# Patient Record
Sex: Female | Born: 1935 | Race: White | Hispanic: No | Smoking: Never smoker
Health system: Southern US, Community
[De-identification: ages and names within clinical notes are randomized; demographics above are authoritative.]

## PROBLEM LIST (undated history)

## (undated) DIAGNOSIS — E785 Hyperlipidemia, unspecified: Secondary | ICD-10-CM

## (undated) DIAGNOSIS — K219 Gastro-esophageal reflux disease without esophagitis: Secondary | ICD-10-CM

## (undated) DIAGNOSIS — C801 Malignant (primary) neoplasm, unspecified: Secondary | ICD-10-CM

## (undated) DIAGNOSIS — I10 Essential (primary) hypertension: Secondary | ICD-10-CM

---

## 2005-08-21 ENCOUNTER — Ambulatory Visit: Payer: Self-pay | Admitting: Internal Medicine

## 2005-09-11 ENCOUNTER — Ambulatory Visit: Payer: Self-pay | Admitting: Unknown Physician Specialty

## 2006-09-23 ENCOUNTER — Ambulatory Visit: Payer: Self-pay | Admitting: Unknown Physician Specialty

## 2007-10-14 ENCOUNTER — Ambulatory Visit: Payer: Self-pay | Admitting: Unknown Physician Specialty

## 2008-10-17 ENCOUNTER — Ambulatory Visit: Payer: Self-pay | Admitting: Unknown Physician Specialty

## 2008-12-19 ENCOUNTER — Ambulatory Visit: Payer: Self-pay | Admitting: Gastroenterology

## 2009-11-13 ENCOUNTER — Ambulatory Visit: Payer: Self-pay | Admitting: Unknown Physician Specialty

## 2009-11-16 ENCOUNTER — Ambulatory Visit: Payer: Self-pay | Admitting: Unknown Physician Specialty

## 2009-12-08 ENCOUNTER — Inpatient Hospital Stay: Payer: Self-pay | Admitting: Orthopedic Surgery

## 2009-12-14 ENCOUNTER — Encounter: Payer: Self-pay | Admitting: Internal Medicine

## 2010-11-29 ENCOUNTER — Ambulatory Visit: Payer: Self-pay | Admitting: Unknown Physician Specialty

## 2011-12-26 ENCOUNTER — Ambulatory Visit: Payer: Self-pay | Admitting: Unknown Physician Specialty

## 2012-01-24 ENCOUNTER — Ambulatory Visit: Payer: Self-pay | Admitting: Gastroenterology

## 2012-04-15 ENCOUNTER — Ambulatory Visit: Payer: Self-pay | Admitting: Family Medicine

## 2013-01-20 ENCOUNTER — Ambulatory Visit: Payer: Self-pay | Admitting: Family Medicine

## 2014-02-02 ENCOUNTER — Ambulatory Visit: Payer: Self-pay | Admitting: Family Medicine

## 2015-03-14 ENCOUNTER — Ambulatory Visit
Admit: 2015-03-14 | Disposition: A | Discharge status: Home / Self Care | Payer: Self-pay | Attending: Gastroenterology | Admitting: Gastroenterology

## 2015-03-20 LAB — SURGICAL PATHOLOGY

## 2015-10-14 ENCOUNTER — Emergency Department
Admission: EM | Admit: 2015-10-14 | Discharge: 2015-10-14 | Disposition: A | Discharge status: Home / Self Care | Payer: Medicare Other | Attending: Emergency Medicine | Admitting: Emergency Medicine

## 2015-10-14 ENCOUNTER — Emergency Department: Payer: Medicare Other

## 2015-10-14 ENCOUNTER — Encounter: Payer: Self-pay | Admitting: Emergency Medicine

## 2015-10-14 DIAGNOSIS — I10 Essential (primary) hypertension: Secondary | ICD-10-CM | POA: Insufficient documentation

## 2015-10-14 DIAGNOSIS — Y998 Other external cause status: Secondary | ICD-10-CM | POA: Diagnosis not present

## 2015-10-14 DIAGNOSIS — Y92009 Unspecified place in unspecified non-institutional (private) residence as the place of occurrence of the external cause: Secondary | ICD-10-CM | POA: Insufficient documentation

## 2015-10-14 DIAGNOSIS — S52615A Nondisplaced fracture of left ulna styloid process, initial encounter for closed fracture: Secondary | ICD-10-CM | POA: Diagnosis not present

## 2015-10-14 DIAGNOSIS — W172XXA Fall into hole, initial encounter: Secondary | ICD-10-CM | POA: Diagnosis not present

## 2015-10-14 DIAGNOSIS — S52612A Displaced fracture of left ulna styloid process, initial encounter for closed fracture: Secondary | ICD-10-CM

## 2015-10-14 DIAGNOSIS — S52122A Displaced fracture of head of left radius, initial encounter for closed fracture: Secondary | ICD-10-CM | POA: Insufficient documentation

## 2015-10-14 DIAGNOSIS — S299XXA Unspecified injury of thorax, initial encounter: Secondary | ICD-10-CM | POA: Diagnosis not present

## 2015-10-14 DIAGNOSIS — Y9389 Activity, other specified: Secondary | ICD-10-CM | POA: Diagnosis not present

## 2015-10-14 DIAGNOSIS — S6992XA Unspecified injury of left wrist, hand and finger(s), initial encounter: Secondary | ICD-10-CM | POA: Diagnosis present

## 2015-10-14 HISTORY — DX: Gastro-esophageal reflux disease without esophagitis: K21.9

## 2015-10-14 HISTORY — DX: Hyperlipidemia, unspecified: E78.5

## 2015-10-14 HISTORY — DX: Essential (primary) hypertension: I10

## 2015-10-14 MED ORDER — IBUPROFEN 400 MG PO TABS: 800.0000 mg | ORAL_TABLET | Freq: Three times a day (TID) | ORAL | Status: AC | PRN

## 2015-10-14 NOTE — ED Notes (Signed)
Pt to ed with c/o left wrist pain after fall into ditch yesterday at home.

## 2015-10-14 NOTE — Discharge Instructions (Signed)
Cast or Splint Care °Casts and splints support injured limbs and keep bones from moving while they heal. It is important to care for your cast or splint at home.   °HOME CARE INSTRUCTIONS °· Keep the cast or splint uncovered during the drying period. It can take 24 to 48 hours to dry if it is made of plaster. A fiberglass cast will dry in less than 1 hour. °· Do not rest the cast on anything harder than a pillow for the first 24 hours. °· Do not put weight on your injured limb or apply pressure to the cast until your health care provider gives you permission. °· Keep the cast or splint dry. Wet casts or splints can lose their shape and may not support the limb as well. A wet cast that has lost its shape can also create harmful pressure on your skin when it dries. Also, wet skin can become infected. °· Cover the cast or splint with a plastic bag when bathing or when out in the rain or snow. If the cast is on the trunk of the body, take sponge baths until the cast is removed. °· If your cast does become wet, dry it with a towel or a blow dryer on the cool setting only. °· Keep your cast or splint clean. Soiled casts may be wiped with a moistened cloth. °· Do not place any hard or soft foreign objects under your cast or splint, such as cotton, toilet paper, lotion, or powder. °· Do not try to scratch the skin under the cast with any object. The object could get stuck inside the cast. Also, scratching could lead to an infection. If itching is a problem, use a blow dryer on a cool setting to relieve discomfort. °· Do not trim or cut your cast or remove padding from inside of it. °· Exercise all joints next to the injury that are not immobilized by the cast or splint. For example, if you have a long leg cast, exercise the hip joint and toes. If you have an arm cast or splint, exercise the shoulder, elbow, thumb, and fingers. °· Elevate your injured arm or leg on 1 or 2 pillows for the first 1 to 3 days to decrease  swelling and pain. It is best if you can comfortably elevate your cast so it is higher than your heart. °SEEK MEDICAL CARE IF:  °· Your cast or splint cracks. °· Your cast or splint is too tight or too loose. °· You have unbearable itching inside the cast. °· Your cast becomes wet or develops a soft spot or area. °· You have a bad smell coming from inside your cast. °· You get an object stuck under your cast. °· Your skin around the cast becomes red or raw. °· You have new pain or worsening pain after the cast has been applied. °SEEK IMMEDIATE MEDICAL CARE IF:  °· You have fluid leaking through the cast. °· You are unable to move your fingers or toes. °· You have discolored (blue or white), cool, painful, or very swollen fingers or toes beyond the cast. °· You have tingling or numbness around the injured area. °· You have severe pain or pressure under the cast. °· You have any difficulty with your breathing or have shortness of breath. °· You have chest pain. °  °This information is not intended to replace advice given to you by your health care provider. Make sure you discuss any questions you have with your health care   provider.   Document Released: 11/08/2000 Document Revised: 09/01/2013 Document Reviewed: 05/20/2013 Elsevier Interactive Patient Education 2016 Elsevier Inc.   Wrist Fracture A wrist fracture is a break or crack in one of the bones of your wrist. Your wrist is made up of eight small bones at the palm of your hand (carpal bones) and two long bones that make up your forearm (radius and ulna). The goal of treatment is to hold the injured bone in place while it heals. Surgery may or may not be needed to care for your injured wrist.  HOME CARE  Keep your injured wrist raised (elevated). Move your fingers as much as you can.  Do not put pressure on any part of your cast or splint. It may break.  Use a plastic bag to protect your cast or splint from water while bathing or showering. Do not  lower your cast or splint into water.  Take medicines only as told by your doctor.  Keep your cast or splint clean and dry. If it gets wet, damaged, or suddenly feels too tight, tell your doctor right away.  Do not use any tobacco products including cigarettes, chewing tobacco, or electronic cigarettes. Tobacco can slow bone healing. If you need help quitting, ask your doctor.  Keep all follow-up visits as told by your doctor. This is important.  Ask your doctor if you should take supplements of calcium and vitamins C and D. GET HELP IF:   Your cast or splint is damaged, breaks, or gets wet.  You have a fever.  You have chills.  You have very bad pain that does not go away.  You have more swelling (inflammation) than before the cast was put on. GET HELP RIGHT AWAY IF:   Your hand or fingernails on the injured arm turn blue or gray, or feel cold or numb.  You lose some feeling in the fingers of your injured arm. MAKE SURE YOU:   Understand these instructions.  Will watch your condition.  Will get help right away if you are not doing well or get worse.   This information is not intended to replace advice given to you by your health care provider. Make sure you discuss any questions you have with your health care provider.   Document Released: 04/29/2008 Document Revised: 12/02/2014 Document Reviewed: 05/25/2012 Elsevier Interactive Patient Education 2016 Elsevier Inc.   Radial Head Fracture A radial head fracture is a break of the radius bone in the forearm. The radial head is the part of the bone near the elbow. These breaks often happen during a fall when you land on the outstretched arm.  HOME CARE  Raise (elevate) the injured part while sitting or lying down.  Put ice on the injured area.  Put ice in a plastic bag.  Place a towel between your skin and the bag.  Leave the ice on for 15-20 minutes, 03-04 times a day.  Move your fingers.  If you have a plaster  or fiberglass cast:  Do not try to scratch the skin under the cast.  Check the skin around the cast every day. Put lotion on any red or sore areas if needed.  Keep your cast dry and clean.  If you have a plaster splint:  Wear the splint as told.  Loosen the elastic around the splint if your fingers become numb, tingle, or turn cold or blue.  Do not put pressure on any part of your cast or splint. Rest your cast  on a pillow for the first 24 hours.  Protect your cast or splint during bathing with a plastic bag. Do not put the cast or splint in water.  Only take medicine as told by your doctor.  Follow up with your doctor. This is important.  Do not over do exercises. GET HELP RIGHT AWAY IF:   Your cast or splint gets damaged or breaks.  You have more pain or puffiness (swelling) than you did before getting the cast.  You have severe pain when stretching your fingers.  There is a bad smell, new stains or yellowish white fluid (pus) coming from under the cast.  Your fingers or hand turn pale, blue, or become cold or lose feeling (numb). MAKE SURE YOU:  Understand these instructions.  Will watch your condition.  Will get help right away if you are not doing well or get worse.   This information is not intended to replace advice given to you by your health care provider. Make sure you discuss any questions you have with your health care provider.   Document Released: 10/30/2009 Document Revised: 02/03/2012 Document Reviewed: 05/24/2015 Elsevier Interactive Patient Education [employer].

## 2015-10-14 NOTE — Care Management Note (Signed)
Case Management Note  Patient Details  Name: Misty Hernandez MRN: [id number] Date of Birth: 01/11/1932  Subjective/Objective:                    Action/Plan:   Expected Discharge Date:                  Expected Discharge Plan:     In-House Referral:     Discharge planning Services     Post Acute Care Choice:    Choice offered to:     DME Arranged:    DME Agency:     HH Arranged:    [place] Agency:     Status of Service:     Medicare Important Message Given:    Date Medicare IM Given:    Medicare IM give by:    Date Additional Medicare IM Given:    Additional Medicare Important Message give by:     If discussed at [place] of Stay Meetings, dates discussed:    Additional Comments:recieved phone call around 1400 from [facility] with [place] inquiring if patient was still here or discharged. I informed her already discharged and was waiting for [facility] to call her to arrange a delivery time. She informed me they could not make delivery to the patient's house but the family could come and pick up the walker /flat form attachment. She informed me she would call the family and I verified the number from the chart. Shortly after conversation ith Misty Hernandez I received a phone call from Misty Hernandez [phone] patient's family member. She reports no call from [facility] and that she called [facility] and was told they could come pick it up , she was asked where she was coming from she said [place] she was told they were closed and could not pick up the walker until Monday. I apologized and told her I would call [facility] and find out what our options were. I called and spoke with Chasity explained the above information she informed me she would call Misty Hernandez. I did a follow up call to Misty Hernandez she informed me no follow up call from [facility].  Will follow up tomorrow 11.20.16 [facility] closed for the evening except for emergencies. I will call Misty Hernandez tomorrow with a follow up she verbalized understanding.   Misty Bible,  RN 10/14/2015, 8:06 PM

## 2015-10-14 NOTE — ED Provider Notes (Signed)
[facility] Emergency Department Provider Note  ____________________________________________  Time seen: Approximately 9:30 AM  I have reviewed the triage vital signs and the nursing notes.   HISTORY  Chief Complaint Wrist Pain    HPI Misty Hernandez is a 81 y.o. female who presents emergency room with left wrist pain. Patient reports that she fell into the ditch yesterday at home. Complains of continued wrist pain. In addition she complains of having some left lateral rib pain from where she fell. Denies any shortness of breath or chest pains at this time.   Past Medical History  Diagnosis Date  . GERD (gastroesophageal reflux disease)   . Hyperlipidemia   . Hypertension     There are no active problems to display for this patient.   History reviewed. No pertinent past surgical history.  Current Outpatient Rx  Name  Route  Sig  Dispense  Refill  . ibuprofen (ADVIL,MOTRIN) 400 MG tablet   Oral   Take 2 tablets (800 mg total) by mouth every 8 (eight) hours as needed.   30 tablet   0     Allergies Review of patient's allergies indicates no known allergies.  History reviewed. No pertinent family history.  Social History Social History  Substance Use Topics  . Smoking status: Never Smoker   . Smokeless tobacco: None  . Alcohol Use: No    Review of Systems Constitutional: No fever/chills Eyes: No visual changes. ENT: No sore throat. Cardiovascular: Denies chest pain. Respiratory: Denies shortness of breath. Gastrointestinal: No abdominal pain.  No nausea, no vomiting.  No diarrhea.  No constipation. Genitourinary: Negative for dysuria. Musculoskeletal: Positive for left wrist pain. Skin: Negative for rash. Neurological: Negative for headaches, focal weakness or numbness.  10-point ROS otherwise negative.  ____________________________________________   PHYSICAL EXAM:  VITAL SIGNS: ED Triage Vitals  Enc Vitals Group     BP  10/14/15 0922 153/48 mmHg     Pulse Rate 10/14/15 0922 71     Resp 10/14/15 0922 18     Temp 10/14/15 0922 98.1 F (36.7 C)     Temp Source 10/14/15 0922 Oral     SpO2 10/14/15 0922 95 %     Weight 10/14/15 0922 190 lb (86.183 kg)     Height 10/14/15 0922 5\' 6"  (1.676 m)     Head Cir --      Peak Flow --      Pain Score 10/14/15 0918 8     Pain Loc --      Pain Edu? --      Excl. in [place]? --     Constitutional: Alert and oriented. Well appearing and in no acute distress.  Cardiovascular: Normal rate, regular rhythm. Grossly normal heart sounds.  Good peripheral circulation. Respiratory: Normal respiratory effort.  No retractions. Lungs CTAB. Musculoskeletal: Left wrist with obvious edema. Distally neurovascularly intact increase pain with pronation supination movement. Left rib cage laterally midaxilla point tenderness no evidence of bruising or ecchymosis. Neurologic:  Normal speech and language. No gross focal neurologic deficits are appreciated. No gait instability. Skin:  Skin is warm, dry and intact. No rash noted. Psychiatric: Mood and affect are normal. Speech and behavior are normal.  ____________________________________________   LABS (all labs ordered are listed, but only abnormal results are displayed)  Labs Reviewed - No data to display  RADIOLOGY  Left wrist: FINDINGS: The bones are osteopenic. There is a mildly impacted fracture involving the distal radius. Mild dorsal angulation of the  distal fracture fragments noted. Nondisplaced ulnar styloid fracture also noted.  IMPRESSION: 1. Distal radius fracture. 2. Ulnar styloid fracture.  Left ribs: Negative for fracture. ____________________________________________   PROCEDURES  Procedure(s) performed: None  Critical Care performed: No  ____________________________________________   INITIAL IMPRESSION / ASSESSMENT AND PLAN / ED COURSE  Pertinent labs & imaging results that were available during my  care of the patient were reviewed by me and considered in my medical decision making (see chart for details).  Discussed all clinical findings with Dr. Mack Guise, orthopedic M.D. on call. We'll place patient and sugar tong splint with a sling. Patient follow-up with him next week for further intervention. Rx given ____________________________________________   FINAL CLINICAL IMPRESSION(S) / ED DIAGNOSES  Final diagnoses:  Radial head fracture, closed, left, initial encounter  Fracture of ulnar styloid, left, closed, initial encounter      Arlyss Repress, PA-C 10/14/15 1233  Orbie Pyo, MD 10/14/15 [phone]

## 2015-10-14 NOTE — Care Management Note (Signed)
Case Management Note  Patient Details  Name: Misty Hernandez MRN: [id number] Date of Birth: [birth year]  Subjective/Objective:   Request for walker with flat form attachment.               Action/Plan:Information faxed to [facility] will call pateint to confirm delivery time.   Expected Discharge Date:                  Expected Discharge Plan:     In-House Referral:     Discharge planning Services     Post Acute Care Choice:    Choice offered to:     DME Arranged:  Walker with Flat form attachment  DME Agency:     HH Arranged:   [place] / [place] Agency:     Status of Service:     Medicare Important Message Given:    Date Medicare IM Given:    Medicare IM give by:    Date Additional Medicare IM Given:    Additional Medicare Important Message give by:     If discussed at [place] of Stay Meetings, dates discussed:    Additional Comments:  Ival Bible, RN 10/14/2015, 1:15 PM

## 2015-10-14 NOTE — Consult Note (Signed)
ORTHOPAEDIC CONSULTATION  REQUESTING PHYSICIAN: Wynema Birch, PA  Chief Complaint: Left wrist pain status post fall  HPI: Misty Hernandez is a 81 y.o. female who complains of  left wrist pain after a fall at home today. She is seen in the ER with her family at the bedside. Patient states that she went to a mailbox and fell when she stepped in a hole. The family also explains that she has had trouble with her balance. She saw a neurologist last week for her balance issues. She uses a cane or a walker for assistance with ambulation. Patient has pain in the left wrist but denies numbness or tingling.  Past Medical History  Diagnosis Date  . GERD (gastroesophageal reflux disease)   . Hyperlipidemia   . Hypertension    History reviewed. No pertinent past surgical history. Social History   Social History  . Marital Status: Widowed    Spouse Name: N/A  . Number of Children: N/A  . Years of Education: N/A   Social History Main Topics  . Smoking status: Never Smoker   . Smokeless tobacco: None  . Alcohol Use: No  . Drug Use: No  . Sexual Activity: Not Asked   Other Topics Concern  . None   Social History Narrative  . None   History reviewed. No pertinent family history. No Known Allergies Prior to Admission medications   Medication Sig Start Date End Date Taking? Authorizing Provider  ibuprofen (ADVIL,MOTRIN) 400 MG tablet Take 2 tablets (800 mg total) by mouth every 8 (eight) hours as needed. 10/14/15   Arlyss Repress, PA-C   Dg Ribs Unilateral W/chest Left  10/14/2015  CLINICAL DATA:  Fall.  Pain to left mid ribs under left breast. EXAM: LEFT RIBS AND CHEST - 3+ VIEW COMPARISON:  12/08/2009 FINDINGS: Heart size is normal. No pleural effusion or edema. There is no airspace consolidation identified. Aortic atherosclerosis noted. No displaced fractures identified. IMPRESSION: 1. No acute findings. 2. Aortic atherosclerosis. Electronically Signed   By: Kerby Moors M.D.   On:  10/14/2015 10:17   Dg Wrist Complete Left  10/14/2015  CLINICAL DATA:  Left wrist pain and swelling after fall. EXAM: LEFT WRIST - COMPLETE 3+ VIEW COMPARISON:  None. FINDINGS: The bones are osteopenic. There is a mildly impacted fracture involving the distal radius. Mild dorsal angulation of the distal fracture fragments noted. Nondisplaced ulnar styloid fracture also noted. IMPRESSION: 1. Distal radius fracture. 2. Ulnar styloid fracture. Electronically Signed   By: Kerby Moors M.D.   On: 10/14/2015 09:47    Positive ROS: All other systems have been reviewed and were otherwise negative with the exception of those mentioned in the HPI and as above.  Physical Exam: General: Alert, no acute distress  MUSCULOSKELETAL: Left wrist: Patient has intact skin. She has swelling over the left wrist. She tenderness of the distal radius but no significant induration or deformity. She can flex and extend all fingers of the left hand. Her fingers are well-perfused and she has a palpable radial pulse. There is no pain with passive stretch of her left fingers. Her forearm compartments are soft and compressible as are the hand compartments.  Assessment: Impacted left distal radius fracture with mild dorsal angulation  Plan: I reviewed the left wrist films taken in the ER today. The patient is an impacted fracture of the left distal radius with mild dorsal angulation and no significant loss of radial height. I explained to the patient that at this time  I would recommend a sugar tong splint be applied by the ED staff. She will elevate her left upper extremity at home. She'll follow up with me in a proximal ascending to 10 days in my office. New x-rays will be taken at that visit. If the patient maintains her current fracture position that she'll be transitioned to a short arm cast. If she shows further displacement of the fracture than a discussion regarding open reduction internal fixation will be had. One of the  patient's family members is a Marine scientist from our PACU. The patient and her family understood and agreed with this plan. I relayed this plan also to [employer], PA in the ER.    Th[place], MD    10/14/2015 5:03 PM

## 2015-10-15 NOTE — Care Management Note (Signed)
Case Management Note  Patient Details  Name: Misty Hernandez MRN: [id number] Date of Birth: [birth year]  Subjective/Objective:    Placed call to [place] spoke with Chasity to follow up on DME order . She states she left a detailed voicemail yesterday, for [name] [phone] they needed the walker immediately she could arrange delivery to the hospital for family to pick up"  She did not get a return call from [place]. She will call [name] this AM and update with disposition.           Action/Plan:   Expected Discharge Date:                  Expected Discharge Plan:     In-House Referral:     Discharge planning Services     Post Acute Care Choice:    Choice offered to:     DME Arranged:    DME Agency:     HH Arranged:    [place] Agency:     Status of Service:     Medicare Important Message Given:    Date Medicare IM Given:    Medicare IM give by:    Date Additional Medicare IM Given:    Additional Medicare Important Message give by:     If discussed at [place] of Stay Meetings, dates discussed:    Additional Comments:  Ival Bible, RN 10/15/2015, 8:22 AM

## 2015-10-15 NOTE — Care Management Note (Signed)
Case Management Note  Patient Details  Name: Misty Hernandez MRN: [id number] Date of Birth: 09-10-1932  Subjective/Objective:                    Action/Plan:  Spoke with Chasity from [employer], she spoke with [name] patient's daughter , she confirmed she did receive both voicemails from [place].  Chasity states." [name] or her sister [name] will pick up the walker tomorrow in [place], they can manage until then and will see [employer] at the store.  Chasity stated ," She was sorry for the miscommunication and [name] was receptive".     Expected Discharge Date:                  Expected Discharge Plan:     In-House Referral:     Discharge planning Services     Post Acute Care Choice:    Choice offered to:     DME Arranged:    DME Agency:     HH Arranged:    [place] Agency:     Status of Service:     Medicare Important Message Given:    Date Medicare IM Given:    Medicare IM give by:    Date Additional Medicare IM Given:    Additional Medicare Important Message give by:     If discussed at [place] of Stay Meetings, dates discussed:    Additional Comments:  Ival Bible, RN 10/15/2015, 4:12 PM

## 2015-11-14 ENCOUNTER — Encounter: Payer: Self-pay | Admitting: Emergency Medicine

## 2015-11-14 ENCOUNTER — Observation Stay
Admission: EM | Admit: 2015-11-14 | Discharge: 2015-11-21 | Disposition: A | Discharge status: Skilled Nursing Facility | Payer: Medicare Other | Attending: Internal Medicine | Admitting: Internal Medicine

## 2015-11-14 ENCOUNTER — Emergency Department: Payer: Medicare Other

## 2015-11-14 DIAGNOSIS — I776 Arteritis, unspecified: Secondary | ICD-10-CM

## 2015-11-14 DIAGNOSIS — R600 Localized edema: Secondary | ICD-10-CM | POA: Insufficient documentation

## 2015-11-14 DIAGNOSIS — R410 Disorientation, unspecified: Secondary | ICD-10-CM

## 2015-11-14 DIAGNOSIS — R300 Dysuria: Secondary | ICD-10-CM | POA: Diagnosis not present

## 2015-11-14 DIAGNOSIS — E785 Hyperlipidemia, unspecified: Secondary | ICD-10-CM | POA: Diagnosis not present

## 2015-11-14 DIAGNOSIS — I482 Chronic atrial fibrillation: Secondary | ICD-10-CM | POA: Diagnosis not present

## 2015-11-14 DIAGNOSIS — I10 Essential (primary) hypertension: Secondary | ICD-10-CM | POA: Insufficient documentation

## 2015-11-14 DIAGNOSIS — Z800 Family history of malignant neoplasm of digestive organs: Secondary | ICD-10-CM | POA: Diagnosis not present

## 2015-11-14 DIAGNOSIS — Z7901 Long term (current) use of anticoagulants: Secondary | ICD-10-CM | POA: Insufficient documentation

## 2015-11-14 DIAGNOSIS — R296 Repeated falls: Secondary | ICD-10-CM | POA: Diagnosis not present

## 2015-11-14 DIAGNOSIS — K219 Gastro-esophageal reflux disease without esophagitis: Secondary | ICD-10-CM | POA: Insufficient documentation

## 2015-11-14 DIAGNOSIS — I6523 Occlusion and stenosis of bilateral carotid arteries: Secondary | ICD-10-CM | POA: Insufficient documentation

## 2015-11-14 DIAGNOSIS — R4182 Altered mental status, unspecified: Secondary | ICD-10-CM | POA: Diagnosis not present

## 2015-11-14 DIAGNOSIS — K5900 Constipation, unspecified: Secondary | ICD-10-CM | POA: Diagnosis not present

## 2015-11-14 DIAGNOSIS — G9340 Encephalopathy, unspecified: Secondary | ICD-10-CM | POA: Diagnosis not present

## 2015-11-14 DIAGNOSIS — R270 Ataxia, unspecified: Secondary | ICD-10-CM | POA: Diagnosis not present

## 2015-11-14 DIAGNOSIS — F0390 Unspecified dementia without behavioral disturbance: Secondary | ICD-10-CM | POA: Insufficient documentation

## 2015-11-14 DIAGNOSIS — R42 Dizziness and giddiness: Secondary | ICD-10-CM | POA: Insufficient documentation

## 2015-11-14 DIAGNOSIS — I480 Paroxysmal atrial fibrillation: Secondary | ICD-10-CM | POA: Diagnosis not present

## 2015-11-14 DIAGNOSIS — Z79899 Other long term (current) drug therapy: Secondary | ICD-10-CM | POA: Diagnosis not present

## 2015-11-14 HISTORY — DX: Malignant (primary) neoplasm, unspecified: C80.1

## 2015-11-14 LAB — CBC WITH DIFFERENTIAL/PLATELET
BASOS ABS: 0 10*3/uL (ref 0–0.1)
Basophils Relative: 0 %
Eosinophils Absolute: 0.2 10*3/uL (ref 0–0.7)
Eosinophils Relative: 2 %
HCT: 42.8 % (ref 35.0–47.0)
Hemoglobin: 14.4 g/dL (ref 12.0–16.0)
LYMPHS ABS: 1.7 10*3/uL (ref 1.0–3.6)
Lymphocytes Relative: 20 %
MCH: 30.4 pg (ref 26.0–34.0)
MCHC: 33.5 g/dL (ref 32.0–36.0)
MCV: 90.6 fL (ref 80.0–100.0)
Monocytes Absolute: 0.5 10*3/uL (ref 0.2–0.9)
Monocytes Relative: 7 %
NEUTROS ABS: 5.9 10*3/uL (ref 1.4–6.5)
Neutrophils Relative %: 71 %
Platelets: 294 10*3/uL (ref 150–440)
RBC: 4.72 MIL/uL (ref 3.80–5.20)
RDW: 13.8 % (ref 11.5–14.5)
WBC: 8.3 10*3/uL (ref 3.6–11.0)

## 2015-11-14 LAB — URINALYSIS COMPLETE WITH MICROSCOPIC (ARMC ONLY)
Bilirubin Urine: NEGATIVE
Glucose, UA: NEGATIVE mg/dL
Ketones, ur: NEGATIVE mg/dL
Nitrite: NEGATIVE
PROTEIN: NEGATIVE mg/dL
SPECIFIC GRAVITY, URINE: 1.018 (ref 1.005–1.030)
pH: 5 (ref 5.0–8.0)

## 2015-11-14 LAB — COMPREHENSIVE METABOLIC PANEL
ALBUMIN: 4.4 g/dL (ref 3.5–5.0)
ALK PHOS: 89 U/L (ref 38–126)
ALT: 9 U/L — AB (ref 14–54)
AST: 19 U/L (ref 15–41)
Anion gap: 10 (ref 5–15)
BILIRUBIN TOTAL: 0.6 mg/dL (ref 0.3–1.2)
BUN: 23 mg/dL — ABNORMAL HIGH (ref 6–20)
CHLORIDE: 98 mmol/L — AB (ref 101–111)
CO2: 29 mmol/L (ref 22–32)
Calcium: 9.2 mg/dL (ref 8.9–10.3)
Creatinine, Ser: 1.04 mg/dL — ABNORMAL HIGH (ref 0.44–1.00)
GFR calc Af Amer: 57 mL/min — ABNORMAL LOW (ref >60)
GFR calc non Af Amer: 49 mL/min — ABNORMAL LOW (ref >60)
GLUCOSE: 114 mg/dL — AB (ref 65–99)
Potassium: 3.7 mmol/L (ref 3.5–5.1)
SODIUM: 137 mmol/L (ref 135–145)
TOTAL PROTEIN: 6.9 g/dL (ref 6.5–8.1)

## 2015-11-14 LAB — TROPONIN I

## 2015-11-14 LAB — TSH: TSH: 1.747 u[IU]/mL (ref 0.350–4.500)

## 2015-11-14 NOTE — ED Notes (Signed)
Patient presents to ED form home with c/o dysuria with (+) vertiginous symptoms that have been worsening over the last few months.Patient has experienced an extended history over the last few months; has been to PCP, [facility], [facility], and [facility]. Daughter reports that patient was "fine" in September and has been going down since then. Patient has been falling around more and has become incontinent of urine. Patient seen and evaluated by [facility] Nakayama and started on Gabapentin. While at [facility] recently she had a NEGATIVE CT, MRI, and full neurological workup per daughter's report. Family having to stay with patient ATC now; also receiving PT/OT services in the home.

## 2015-11-15 ENCOUNTER — Observation Stay: Payer: Medicare Other

## 2015-11-15 ENCOUNTER — Encounter: Payer: Self-pay | Admitting: Internal Medicine

## 2015-11-15 DIAGNOSIS — R296 Repeated falls: Secondary | ICD-10-CM

## 2015-11-15 LAB — CREATININE, SERUM
Creatinine, Ser: 0.92 mg/dL (ref 0.44–1.00)
GFR, EST NON AFRICAN AMERICAN: 57 mL/min — AB (ref >60)

## 2015-11-15 LAB — CBC
HCT: 39.9 % (ref 35.0–47.0)
Hemoglobin: 13.5 g/dL (ref 12.0–16.0)
MCH: 30.4 pg (ref 26.0–34.0)
MCHC: 33.9 g/dL (ref 32.0–36.0)
MCV: 89.6 fL (ref 80.0–100.0)
Platelets: 223 10*3/uL (ref 150–440)
RBC: 4.45 MIL/uL (ref 3.80–5.20)
RDW: 13.7 % (ref 11.5–14.5)
WBC: 7.2 10*3/uL (ref 3.6–11.0)

## 2015-11-15 LAB — SEDIMENTATION RATE: Sed Rate: 10 mm/hr (ref 0–30)

## 2015-11-15 LAB — AMMONIA: AMMONIA: 16 umol/L (ref 9–35)

## 2015-11-15 LAB — VITAMIN B12: VITAMIN B 12: 524 pg/mL (ref 180–914)

## 2015-11-15 LAB — FOLATE: Folate: 30 ng/mL (ref >5.9)

## 2015-11-15 MED ORDER — APIXABAN 5 MG PO TABS
5.0000 mg | ORAL_TABLET | Freq: Two times a day (BID) | ORAL | Status: DC
  Administered 2015-11-15 – 2015-11-16 (×3): 5 mg via ORAL
  Filled 2015-11-15 (×3): qty 1

## 2015-11-15 MED ORDER — VITAMIN B-12 1000 MCG PO TABS
1000.0000 ug | ORAL_TABLET | Freq: Every day | ORAL | Status: DC
  Administered 2015-11-15 – 2015-11-20 (×6): 1000 ug via ORAL
  Filled 2015-11-15 (×6): qty 1

## 2015-11-15 MED ORDER — ADULT MULTIVITAMIN W/MINERALS CH
1.0000 | ORAL_TABLET | Freq: Every day | ORAL | Status: DC
  Administered 2015-11-15 – 2015-11-20 (×6): 1 via ORAL
  Filled 2015-11-15 (×6): qty 1

## 2015-11-15 MED ORDER — SODIUM CHLORIDE 0.9 % IV SOLN
INTRAVENOUS | Status: DC
  Administered 2015-11-15 (×2): via INTRAVENOUS

## 2015-11-15 MED ORDER — METOPROLOL TARTRATE 25 MG PO TABS
25.0000 mg | ORAL_TABLET | Freq: Two times a day (BID) | ORAL | Status: DC
  Administered 2015-11-15 – 2015-11-21 (×14): 25 mg via ORAL
  Filled 2015-11-15 (×14): qty 1

## 2015-11-15 MED ORDER — MECLIZINE HCL 12.5 MG PO TABS
12.5000 mg | ORAL_TABLET | Freq: Three times a day (TID) | ORAL | Status: DC | PRN
  Administered 2015-11-16 – 2015-11-17 (×2): 12.5 mg via ORAL
  Filled 2015-11-15 (×3): qty 1

## 2015-11-15 MED ORDER — HEPARIN SODIUM (PORCINE) 5000 UNIT/ML IJ SOLN: 5000.0000 [IU] | Freq: Three times a day (TID) | INTRAMUSCULAR | Status: DC

## 2015-11-15 MED ORDER — OMEGA-3-ACID ETHYL ESTERS 1 G PO CAPS
1.0000 g | ORAL_CAPSULE | Freq: Every day | ORAL | Status: DC
  Administered 2015-11-15 – 2015-11-20 (×6): 1 g via ORAL
  Filled 2015-11-15 (×6): qty 1

## 2015-11-15 MED ORDER — ACETAMINOPHEN 650 MG RE SUPP: 650.0000 mg | Freq: Four times a day (QID) | RECTAL | Status: DC | PRN

## 2015-11-15 MED ORDER — ONDANSETRON HCL 4 MG PO TABS: 4.0000 mg | ORAL_TABLET | Freq: Four times a day (QID) | ORAL | Status: DC | PRN

## 2015-11-15 MED ORDER — HYDRALAZINE HCL 20 MG/ML IJ SOLN
10.0000 mg | INTRAMUSCULAR | Status: AC
  Administered 2015-11-15: 10 mg via INTRAVENOUS
  Filled 2015-11-15: qty 1

## 2015-11-15 MED ORDER — ONDANSETRON HCL 4 MG/2ML IJ SOLN
4.0000 mg | Freq: Four times a day (QID) | INTRAMUSCULAR | Status: DC | PRN
Start: 2015-11-15 — End: 2015-11-21
  Administered 2015-11-20: 4 mg via INTRAVENOUS
  Filled 2015-11-15: qty 2

## 2015-11-15 MED ORDER — PANTOPRAZOLE SODIUM 40 MG PO TBEC
40.0000 mg | DELAYED_RELEASE_TABLET | Freq: Every day | ORAL | Status: DC
  Administered 2015-11-15 – 2015-11-21 (×7): 40 mg via ORAL
  Filled 2015-11-15 (×7): qty 1

## 2015-11-15 MED ORDER — HYDRALAZINE HCL 20 MG/ML IJ SOLN: 10.0000 mg | Freq: Four times a day (QID) | INTRAMUSCULAR | Status: DC | PRN

## 2015-11-15 MED ORDER — OXYCODONE HCL 5 MG PO TABS
5.0000 mg | ORAL_TABLET | ORAL | Status: DC | PRN
  Administered 2015-11-16 – 2015-11-20 (×7): 5 mg via ORAL
  Filled 2015-11-15 (×7): qty 1

## 2015-11-15 MED ORDER — ACETAMINOPHEN 325 MG PO TABS
650.0000 mg | ORAL_TABLET | Freq: Four times a day (QID) | ORAL | Status: DC | PRN
  Administered 2015-11-15 – 2015-11-20 (×3): 650 mg via ORAL
  Filled 2015-11-15 (×3): qty 2

## 2015-11-15 NOTE — Progress Notes (Signed)
Pt oob to chair after orthostats. Lends backward when sitting.

## 2015-11-15 NOTE — Progress Notes (Signed)
bp 160/44.after hydralazine. Pt now very alert and orientedx4

## 2015-11-15 NOTE — Consult Note (Signed)
[facility] Cardiology  CARDIOLOGY CONSULT NOTE  Patient ID: MAKAYLIA KOONE MRN: [id number] DOB/AGE: 30-Jan-1932 81 y.o.  Admit date: 11/14/2015 Referring Physician Verdell Carmine Primary Physician [facility] Primary Cardiologist Nehemiah Massed Reason for Consultation atrial fibrillation  HPI: 81 year old female with history of paroxysmal atrial fibrillation referred for revascularization of appropriate anticoagulation. The patient has a chads Vasc 4, currently on Eliquis for stroke prevention. The patient has had a history of falling, 3-4 episodes of falling during the past 3-4 weeks. Last episode was witnessed by her daughter. A shunt apparently was walking out of the bathroom with her walker, became lightheaded, and slumped to the floor. Patient has been evaluated by Dr. Melrose Nakayama. The patient  is admitted with recent confusion, and occult the ambulating. Head CT revealed chronic ischemic changes without any acute abnormality. Brain MRI is pending. The patient has been seen by Dr. Tamala Julian who feels that the patient may be experiencing onset dementia as well as possible seizures.  Review of systems complete and found to be negative unless listed above     Past Medical History  Diagnosis Date  . GERD (gastroesophageal reflux disease)   . Hyperlipidemia   . Hypertension     History reviewed. No pertinent past surgical history.  Prescriptions prior to admission  Medication Sig Dispense Refill Last Dose  . apixaban (ELIQUIS) 5 MG TABS tablet Take 1 tablet by mouth 2 (two) times daily.   11/14/2015 at Unknown time  . ibuprofen (ADVIL,MOTRIN) 400 MG tablet Take 2 tablets (800 mg total) by mouth every 8 (eight) hours as needed. 30 tablet 0 prn at prn  . metoprolol tartrate (LOPRESSOR) 25 MG tablet Take 25 mg by mouth 2 (two) times daily.   11/14/2015 at 0930  . Multiple Vitamin (MULTIVITAMIN WITH MINERALS) TABS tablet Take 1 tablet by mouth daily.   11/14/2015 at Unknown time  . omega-3 acid ethyl esters (LOVAZA) 1 G  capsule Take 1 g by mouth daily.   11/14/2015 at Unknown time  . omeprazole (PRILOSEC) 20 MG capsule Take 1 capsule by mouth daily.   11/14/2015 at Unknown time  . triamterene-hydrochlorothiazide (MAXZIDE-25) 37.5-25 MG tablet Take 0.5 tablets by mouth daily.   11/14/2015 at Unknown time  . vitamin B-12 (CYANOCOBALAMIN) 1000 MCG tablet Take 1,000 mcg by mouth daily.   11/14/2015 at Unknown time   Social History   Social History  . Marital Status: Widowed    Spouse Name: N/A  . Number of Children: N/A  . Years of Education: N/A   Occupational History  . Not on file.   Social History Main Topics  . Smoking status: Never Smoker   . Smokeless tobacco: Not on file  . Alcohol Use: No  . Drug Use: No  . Sexual Activity: Not on file   Other Topics Concern  . Not on file   Social History Narrative    Family History  Problem Relation Age of Onset  . Hypertension Other       Review of systems complete and found to be negative unless listed above      PHYSICAL EXAM  General: Well developed, well nourished, in no acute distress HEENT:  Normocephalic and atramatic Neck:  No JVD.  Lungs: Clear bilaterally to auscultation and percussion. Heart: HRRR . Normal S1 and S2 without gallops or murmurs.  Abdomen: Bowel sounds are positive, abdomen soft and non-tender  Msk:  Back normal, normal gait. Normal strength and tone for age. Extremities: No clubbing, cyanosis or edema.   Neuro:  Alert and oriented X 3. Psych:  Good affect, responds appropriately  Labs:   Lab Results  Component Value Date   WBC 7.2 11/15/2015   HGB 13.5 11/15/2015   HCT 39.9 11/15/2015   MCV 89.6 11/15/2015   PLT 223 11/15/2015    Recent Labs Lab 11/14/15 2103 11/15/15 0243  NA 137  --   K 3.7  --   CL 98*  --   CO2 29  --   BUN 23*  --   CREATININE 1.04* 0.92  CALCIUM 9.2  --   PROT 6.9  --   BILITOT 0.6  --   ALKPHOS 89  --   ALT 9*  --   AST 19  --   GLUCOSE 114*  --    Lab Results   Component Value Date   TROPONINI <0.03 11/14/2015   No results found for: CHOL No results found for: HDL No results found for: LDLCALC No results found for: TRIG No results found for: CHOLHDL No results found for: LDLDIRECT    Radiology: Ct Head Wo Contrast  11/14/2015  CLINICAL DATA:  Effusion EXAM: CT HEAD WITHOUT CONTRAST TECHNIQUE: Contiguous axial images were obtained from the base of the skull through the vertex without intravenous contrast. COMPARISON:  None. FINDINGS: Bony calvarium is intact. No findings to suggest acute hemorrhage, acute infarction or space-occupying mass lesion are noted. A small area decreased attenuation is noted in the anterior horn of the left internal capsule consistent with prior lacunar infarct. IMPRESSION: Chronic ischemic change without evidence of acute abnormality. Electronically Signed   By: Inez Catalina M.D.   On: 11/14/2015 21:29    EKG: Sinus rhythm  ASSESSMENT AND PLAN:   1. Paroxysmal atrial fibrillation, chads Vasc 4, currently on Eliquis, which has been tolerated well. I discussed in detail, with the patient and family members, with regard to the risks, benefits and alternatives of chronic anticoagulation. 2. Recent frequent falls, unknown etiology, possible rapid onset dementia, versus seizures, brain MRI pending, followed by neurology.  Recommendations  1. Continue current therapy 2. Continue Eliquis for now, may need to consider stopping pending neurological workup 3. Review 2-D echocardiogram  Signed: Fawn Desrocher MD,PhD, [facility] 11/15/2015, 5:14 PM

## 2015-11-15 NOTE — Progress Notes (Addendum)
bp elevated 190/80  Manually.pt very confused. Dr Verdell Carmine notified. md orders hydralazine 10mg  iv now and [id number] prn systolic [id number]

## 2015-11-15 NOTE — Progress Notes (Signed)
[place] at [place] NAME: Misty Hernandez    MR#:  [id number]  DATE OF BIRTH:  1932/10/02  SUBJECTIVE:   Patient here due to recurrent falls and dizziness. Denies any chest pain, palpitations.  Family at bedside.  No true syncope.    REVIEW OF SYSTEMS:    Review of Systems  Constitutional: Negative for fever and chills.  HENT: Negative for congestion and tinnitus.   Eyes: Negative for blurred vision and double vision.  Respiratory: Negative for cough, shortness of breath and wheezing.   Cardiovascular: Negative for chest pain, orthopnea and PND.  Gastrointestinal: Negative for nausea, vomiting, abdominal pain and diarrhea.  Genitourinary: Negative for dysuria and hematuria.  Musculoskeletal: Positive for falls.  Neurological: Positive for dizziness. Negative for sensory change and focal weakness.  All other systems reviewed and are negative.   Nutrition: Heart healthy Tolerating Diet: Yes Tolerating PT: Await Evaluation   DRUG ALLERGIES:  No Known Allergies  VITALS:  Blood pressure 151/56, pulse 59, temperature 98.3 F (36.8 C), temperature source Oral, resp. rate 15, height 5' 5"  (1.651 m), weight 85.821 kg (189 lb 3.2 oz), SpO2 96 %.  PHYSICAL EXAMINATION:   Physical Exam  GENERAL:  81 y.o.-year-old patient lying in the bed with no acute distress.  EYES: Pupils equal, round, reactive to light and accommodation. No scleral icterus. Extraocular muscles intact.  HEENT: Head atraumatic, normocephalic. Oropharynx and nasopharynx clear.  NECK:  Supple, no jugular venous distention. No thyroid enlargement, no tenderness.  LUNGS: Normal breath sounds bilaterally, no wheezing, rales, rhonchi. No use of accessory muscles of respiration.  CARDIOVASCULAR: S1, S2 normal. No murmurs, rubs, or gallops.  ABDOMEN: Soft, nontender, nondistended. Bowel sounds present. No organomegaly or mass.  EXTREMITIES: No cyanosis, clubbing or edema  b/l.  Left arm in a cast.    NEUROLOGIC: Cranial nerves II through XII are intact. No focal Motor or sensory deficits b/l.   PSYCHIATRIC: The patient is alert and oriented x 3. Good affect.  SKIN: No obvious rash, lesion, or ulcer.    LABORATORY PANEL:   CBC  Recent Labs Lab 11/15/15 0243  WBC 7.2  HGB 13.5  HCT 39.9  PLT 223   ------------------------------------------------------------------------------------------------------------------  Chemistries   Recent Labs Lab 11/14/15 2103 11/15/15 0243  NA 137  --   K 3.7  --   CL 98*  --   CO2 29  --   GLUCOSE 114*  --   BUN 23*  --   CREATININE 1.04* 0.92  CALCIUM 9.2  --   AST 19  --   ALT 9*  --   ALKPHOS 89  --   BILITOT 0.6  --    ------------------------------------------------------------------------------------------------------------------  Cardiac Enzymes  Recent Labs Lab 11/14/15 2103  TROPONINI <0.03   ------------------------------------------------------------------------------------------------------------------  RADIOLOGY:  Ct Head Wo Contrast  11/14/2015  CLINICAL DATA:  Effusion EXAM: CT HEAD WITHOUT CONTRAST TECHNIQUE: Contiguous axial images were obtained from the base of the skull through the vertex without intravenous contrast. COMPARISON:  None. FINDINGS: Bony calvarium is intact. No findings to suggest acute hemorrhage, acute infarction or space-occupying mass lesion are noted. A small area decreased attenuation is noted in the anterior horn of the left internal capsule consistent with prior lacunar infarct. IMPRESSION: Chronic ischemic change without evidence of acute abnormality. Electronically Signed   By: Inez Catalina M.D.   On: 11/14/2015 21:29     ASSESSMENT AND PLAN:   81 year old female with past  history of chronic atrial fibrillation, HTN, Hyperlipidemia, who presents to the hospital due to recurrent falls and dizziness.  #1 recurrent falls/dizziness-etiology unclear  presently. Patient apparently 2 months ago was driving and living independently. Patient CT head on admission is negative. She had an MRI of the brain at [facility] about a month or so ago which was also negative. She has a nonfocal neurological exam. -Patient is following up with Dr. Melrose Nakayama as an outpatient and has had a negative workup so far.  -Appreciate neurology input and they think this could be rapid onset dementia/questionable seizures -We'll repeat MRI of the brain, check EEG. Orthostatics are negative so far. -Check check ESR, CRP, B12/folate, vitamin E and ammonia. -Physical therapy evaluation noted.  #2 chronic atrial fibrillation-rate controlled. Continue metoprolol. -Continue Eliquis.   #3 GERD-continue Protonix.  #4 hypertension-continue metoprolol.   All the records are reviewed and case discussed with Care Management/Social Workerr. Management plans discussed with the patient, family and they are in agreement.  CODE STATUS: Full  DVT Prophylaxis: Eliquis  TOTAL TIME TAKING CARE OF THIS PATIENT: 30 minutes.   POSSIBLE D/C IN 1-2 DAYS, DEPENDING ON CLINICAL CONDITION.   Henreitta Leber M.D on 11/15/2015 at 2:38 PM  Between 7am to 6pm - Pager - [phone]  After 6pm go to [email or web address] - password [email or web address] [facility]  [facility]  Office  [phone]  CC: Primary care physician; [place] Pink, MD

## 2015-11-15 NOTE — H&P (Signed)
[facility] at [place] NAME: Misty Hernandez    MR#:  [id number]  DATE OF BIRTH:  [birth year]   DATE OF ADMISSION:  11/14/2015  PRIMARY CARE PHYSICIAN: [place] Pink, MD   REQUESTING/REFERRING PHYSICIAN: Marcelene Butte  CHIEF COMPLAINT:   Frequent falls  HISTORY OF PRESENT ILLNESS:  Misty Hernandez  is a 81 y.o. female with a known history of paroxysmal atrial fibrillation presenting with frequent falls. The patient states she has been having increased frequency of falls over the past several months nose is to the point where she is unable to ambulate. She does have physical therapy at home despite these measures she is still unable to ambulate. She describes having a "dizzy sensation which she further describes as wobbly, no lightheadedness no room spinning sensation. She denies any issues with hearing or further symptomatology at this time. Of note she has been evaluated by her PCP as well as cardiology as well as neurology-Dr. Melrose Nakayama  PAST MEDICAL HISTORY:   Past Medical History  Diagnosis Date  . GERD (gastroesophageal reflux disease)   . Hyperlipidemia   . Hypertension     PAST SURGICAL HISTORY:  History reviewed. No pertinent past surgical history.  SOCIAL HISTORY:   Social History  Substance Use Topics  . Smoking status: Never Smoker   . Smokeless tobacco: Not on file  . Alcohol Use: No    FAMILY HISTORY:   Family History  Problem Relation Age of Onset  . Hypertension Other     DRUG ALLERGIES:  No Known Allergies  REVIEW OF SYSTEMS:  REVIEW OF SYSTEMS:  CONSTITUTIONAL: Denies fevers, chills, fatigue, weakness.  EYES: Denies blurred vision, double vision, or eye pain.  EARS, NOSE, THROAT: Denies tinnitus, ear pain, hearing loss.  RESPIRATORY: denies cough, shortness of breath, wheezing  CARDIOVASCULAR: Denies chest pain, palpitations, edema.  GASTROINTESTINAL: Denies nausea, vomiting, diarrhea, abdominal pain.   GENITOURINARY: Denies dysuria, hematuria.  ENDOCRINE: Denies nocturia or thyroid problems. HEMATOLOGIC AND LYMPHATIC: Denies easy bruising or bleeding.  SKIN: Denies rash or lesions.  MUSCULOSKELETAL: Denies pain in neck, back, shoulder, knees, hips, or further arthritic symptoms.  NEUROLOGIC: Denies paralysis, paresthesias.  PSYCHIATRIC: Denies anxiety or depressive symptoms. Otherwise full review of systems performed by me is negative.   MEDICATIONS AT HOME:   Prior to Admission medications   Medication Sig Start Date End Date Taking? Authorizing Provider  apixaban (ELIQUIS) 5 MG TABS tablet Take 1 tablet by mouth 2 (two) times daily. 10/19/15 11/18/15 Yes Historical Provider, MD  ibuprofen (ADVIL,MOTRIN) 400 MG tablet Take 2 tablets (800 mg total) by mouth every 8 (eight) hours as needed. 10/14/15  Yes Pierce Crane Beers, PA-C  metoprolol tartrate (LOPRESSOR) 25 MG tablet Take 25 mg by mouth 2 (two) times daily.   Yes Historical Provider, MD  Multiple Vitamin (MULTIVITAMIN WITH MINERALS) TABS tablet Take 1 tablet by mouth daily.   Yes Historical Provider, MD  omega-3 acid ethyl esters (LOVAZA) 1 G capsule Take 1 g by mouth daily.   Yes Historical Provider, MD  omeprazole (PRILOSEC) 20 MG capsule Take 1 capsule by mouth daily. 05/30/15  Yes Historical Provider, MD  triamterene-hydrochlorothiazide (MAXZIDE-25) 37.5-25 MG tablet Take 0.5 tablets by mouth daily. 09/05/15  Yes Historical Provider, MD  vitamin B-12 (CYANOCOBALAMIN) 1000 MCG tablet Take 1,000 mcg by mouth daily.   Yes Historical Provider, MD      VITAL SIGNS:  Blood pressure 180/70, pulse 71, temperature 97.7 F (36.5 C), temperature  source Oral, resp. rate 21, height 5\' 5"  (1.651 m), weight 190 lb (86.183 kg), SpO2 98 %.  PHYSICAL EXAMINATION:  VITAL SIGNS: Filed Vitals:   11/14/15 2230 11/14/15 2300  BP: 175/63 180/70  Pulse: 70 71  Temp:    Resp: 23 21   GENERAL:81 y.o.female currently in no acute distress.  HEAD:  Normocephalic, atraumatic.  EYES: Pupils equal, round, reactive to light. Extraocular muscles intact. No scleral icterus.  MOUTH: Moist mucosal membrane. Dentition intact. No abscess noted.  EAR, NOSE, THROAT: Clear without exudates. No external lesions.  NECK: Supple. No thyromegaly. No nodules. No JVD.  PULMONARY: Clear to ascultation, without wheeze rails or rhonci. No use of accessory muscles, Good respiratory effort. good air entry bilaterally CHEST: Nontender to palpation.  CARDIOVASCULAR: S1 and S2. Regular rate and rhythm. No murmurs, rubs, or gallops. No edema. Pedal pulses 2+ bilaterally.  GASTROINTESTINAL: Soft, nontender, nondistended. No masses. Positive bowel sounds. No hepatosplenomegaly.  MUSCULOSKELETAL: No swelling, clubbing, or edema. Range of motion full in all extremities. Left upper extremity with cast in place NEUROLOGIC: Cranial nerves II through XII are intact. Slowed dysdiadochokinesis as well as occasional intention tremor proprioception intact Sensation intact. Reflexes intact.  SKIN: No ulceration, lesions, rashes, or cyanosis. Skin warm and dry. Turgor intact.  PSYCHIATRIC: Mood, affect within normal limits. The patient is awake, alert and oriented x 3. Insight, judgment intact.    LABORATORY PANEL:   CBC  Recent Labs Lab 11/14/15 2103  WBC 8.3  HGB 14.4  HCT 42.8  PLT 294   ------------------------------------------------------------------------------------------------------------------  Chemistries   Recent Labs Lab 11/14/15 2103  NA 137  K 3.7  CL 98*  CO2 29  GLUCOSE 114*  BUN 23*  CREATININE 1.04*  CALCIUM 9.2  AST 19  ALT 9*  ALKPHOS 89  BILITOT 0.6   ------------------------------------------------------------------------------------------------------------------  Cardiac Enzymes  Recent Labs Lab 11/14/15 2103  TROPONINI <0.03    ------------------------------------------------------------------------------------------------------------------  RADIOLOGY:  Ct Head Wo Contrast  11/14/2015  CLINICAL DATA:  Effusion EXAM: CT HEAD WITHOUT CONTRAST TECHNIQUE: Contiguous axial images were obtained from the base of the skull through the vertex without intravenous contrast. COMPARISON:  None. FINDINGS: Bony calvarium is intact. No findings to suggest acute hemorrhage, acute infarction or space-occupying mass lesion are noted. A small area decreased attenuation is noted in the anterior horn of the left internal capsule consistent with prior lacunar infarct. IMPRESSION: Chronic ischemic change without evidence of acute abnormality. Electronically Signed   By: Inez Catalina M.D.   On: 11/14/2015 21:29    EKG:   Orders placed or performed in visit on 12/08/09  . EKG 12-Lead    IMPRESSION AND PLAN:   81 year old Caucasian female history of cystoscopy atrial fibrillation who is presenting with frequent falls.  1. Frequent falls she has had a neurological workup performed by Dr. Melrose Nakayama can't find further details of such information at this time. Majority workup has been normal in the past however we'll check vitamin D levels, B12, zinc and thiamine-consult physical therapy  2. Paroxysmal atrial fibrillation currently in normal sinus rhythm will consult cardiology to continue the discussion about safety of anticoagulation. I discussed at length with patient and family at bedside her being on eliquis may be a poor choice given frequent falls. 3. GERD without esophagitis PPI therapy 4. Venous thromboembolism prophylactic therapeutic eliquis  All the records are reviewed and case discussed with ED provider. Management plans discussed with the patient, family and they are in agreement.  CODE  STATUS: Full  TOTAL TIME TAKING CARE OF THIS PATIENT: 45 minutes.    Devonia Farro,  Karenann Cai.D on 11/15/2015 at 12:46 AM  Between 7am to 6pm -  Pager - [phone]  After 6pm: House Pager: - [phone]  [facility]  Office  [phone]  CC: Primary care physician; Maryland Pink, MD

## 2015-11-15 NOTE — Progress Notes (Addendum)
rn re:; contacted dr Hamilton Capri  : pt with np cough x1 month. Hx  Of speech eval  At [facility] In the past .orthostatic vs reported . md reports pt lungs are clear and he will not order speech eval at this time. Shuffling gait noted

## 2015-11-15 NOTE — Plan of Care (Signed)
Problem: Acute Rehab PT Goals(only PT should resolve) Goal: Patient Will Transfer Sit To/From Stand Pt will transfer sit to/from-stand with RW at [place] without loss-of-balance to demonstrate good safety awareness for independent mobility in home.     Goal: Pt Will Ambulate Pt will ambulate with RW at [place] using a step-through pattern and equal step length >12 inches for a distances greater than 270ft to demonstrate the ability to perform safe household distance ambulation at discharge.

## 2015-11-15 NOTE — ED Provider Notes (Signed)
Time Seen: Approximately 2040  I have reviewed the triage notes  Chief Complaint: Dysuria   History of Present Illness: Misty Hernandez is a 81 y.o. female who presents with multiple concerns mostly expressed by her family. Patient states that since September of the patient's "" gotten steadily downhill "". They describe some generalized weakness with difficulty with ambulation. Patient had an extensive evaluation at Thanksgiving at [facility] with essentially a negative MRI etc. She still has not had any official diagnosis otherwise she's had increased generalized weakness with ataxia and some intermittent altered mental status. She apparently said difficulty with home physical therapy and has had 5 falls over the last several weeks. Patient does have follow-up planned with a neurologist and also cardiologist etc. The family was concerned because she's gotten so rapidly worse and are having difficulty taking care of her at home. Family is describing some short-term memory deficits. Past Medical History  Diagnosis Date  . GERD (gastroesophageal reflux disease)   . Hyperlipidemia   . Hypertension     Patient Active Problem List   Diagnosis Date Noted  . Frequent falls 11/15/2015    History reviewed. No pertinent past surgical history.  History reviewed. No pertinent past surgical history.  Current Outpatient Rx  Name  Route  Sig  Dispense  Refill  . apixaban (ELIQUIS) 5 MG TABS tablet   Oral   Take 1 tablet by mouth 2 (two) times daily.         Marland Kitchen ibuprofen (ADVIL,MOTRIN) 400 MG tablet   Oral   Take 2 tablets (800 mg total) by mouth every 8 (eight) hours as needed.   30 tablet   0   . metoprolol tartrate (LOPRESSOR) 25 MG tablet   Oral   Take 25 mg by mouth 2 (two) times daily.         . Multiple Vitamin (MULTIVITAMIN WITH MINERALS) TABS tablet   Oral   Take 1 tablet by mouth daily.         Marland Kitchen omega-3 acid ethyl esters (LOVAZA) 1 G capsule   Oral   Take 1  g by mouth daily.         Marland Kitchen omeprazole (PRILOSEC) 20 MG capsule   Oral   Take 1 capsule by mouth daily.         Marland Kitchen triamterene-hydrochlorothiazide (MAXZIDE-25) 37.5-25 MG tablet   Oral   Take 0.5 tablets by mouth daily.         . vitamin B-12 (CYANOCOBALAMIN) 1000 MCG tablet   Oral   Take 1,000 mcg by mouth daily.           Allergies:  Review of patient's allergies indicates no known allergies.  Family History: Family History  Problem Relation Age of Onset  . Hypertension Other     Social History: Social History  Substance Use Topics  . Smoking status: Never Smoker   . Smokeless tobacco: None  . Alcohol Use: No     Review of Systems:   10 point review of systems was performed and was otherwise negative:  Constitutional: No fever Eyes: No visual disturbances ENT: No sore throat, ear pain Cardiac: Persistent chest discomfort over the last several months and points to the upper part of her chest without radiation Respiratory: No shortness of breath, wheezing, or stridor Abdomen: No abdominal pain, no vomiting, No diarrhea Endocrine: No weight loss, No night sweats Extremities: No peripheral edema, cyanosis Skin: No rashes, easy bruising Neurologic: No focal weakness,  trouble with speech or swollowing Urology: Patient's had new onset incontinence of urine.  Physical Exam:  ED Triage Vitals  Enc Vitals Group     BP 11/14/15 2032 159/66 mmHg     Pulse Rate 11/14/15 2032 73     Resp 11/14/15 2101 22     Temp 11/14/15 2032 97.7 F (36.5 C)     Temp Source 11/14/15 2032 Oral     SpO2 11/14/15 2032 97 %     Weight 11/14/15 2032 190 lb (86.183 kg)     Height 11/14/15 2032 5\' 5"  (1.651 m)     Head Cir --      Peak Flow --      Pain Score 11/14/15 2031 0     Pain Loc --      Pain Edu? --      Excl. in [place]? --     General: Awake , Alert , and Oriented times 3; GCS 15. Patient does describe who the president and newly elected present are at this time.  She does not exhibit any difficulty initiating speech or swallowing Head: Normal cephalic , atraumatic Eyes: Pupils equal , round, reactive to light Nose/Throat: No nasal drainage, patent upper airway without erythema or exudate.  Neck: Supple, Full range of motion, No anterior adenopathy or palpable thyroid masses Lungs: Clear to ascultation without wheezes , rhonchi, or rales Heart: Regular rate, regular rhythm without murmurs , gallops , or rubs Abdomen: Soft, non tender without rebound, guarding , or rigidity; bowel sounds positive and symmetric in all 4 quadrants. No organomegaly .        Extremities: 2 plus symmetric pulses. No edema, clubbing or cyanosis Neurologic: Patient had difficulty with ambulation and seems to drift to the right. Patient has no focal neurologic deficits and seems to be diffusely 4 out of 5 strength which is symmetric sensory is intact, negative drift. Skin: warm, dry, no rashes   Labs:   All laboratory work was reviewed including any pertinent negatives or positives listed below:  Labs Reviewed  COMPREHENSIVE METABOLIC PANEL - Abnormal; Notable for the following:    Chloride 98 (*)    Glucose, Bld 114 (*)    BUN 23 (*)    Creatinine, Ser 1.04 (*)    ALT 9 (*)    GFR calc non Af Amer 49 (*)    GFR calc Af Amer 57 (*)    All other components within normal limits  URINALYSIS COMPLETEWITH MICROSCOPIC ([facility] ONLY) - Abnormal; Notable for the following:    Color, Urine YELLOW (*)    APPearance HAZY (*)    Hgb urine dipstick 1+ (*)    Leukocytes, UA TRACE (*)    Bacteria, UA MANY (*)    Squamous Epithelial / LPF 0-5 (*)    All other components within normal limits  TSH  CBC WITH DIFFERENTIAL/PLATELET  TROPONIN I  CBC  CREATININE, SERUM  VITAMIN D 25 HYDROXY (VIT D DEFICIENCY, FRACTURES)  VITAMIN B12  VITAMIN B1  ZINC   laboratory work shows some bacteria but no obvious signs of a urinary tract infection  EKG:  ED ECG REPORT I, Daymon Larsen,  the attending physician, personally viewed and interpreted this ECG.  Date: 11/15/2015 EKG Time: *2055 Rate: 71 Rhythm: normal sinus rhythm QRS Axis: normal Intervals: Left bundle branch block ST/T Wave abnormalities: normal Conduction Disutrbances: none Narrative Interpretation: unremarkable  No acute ischemic changes  Radiology:  EXAM: CT HEAD WITHOUT CONTRAST  TECHNIQUE: Contiguous  axial images were obtained from the base of the skull through the vertex without intravenous contrast.  COMPARISON: None.  FINDINGS: Bony calvarium is intact. No findings to suggest acute hemorrhage, acute infarction or space-occupying mass lesion are noted. A small area decreased attenuation is noted in the anterior horn of the left internal capsule consistent with prior lacunar infarct.  IMPRESSION: Chronic ischemic change without evidence of acute abnormality.     I personally reviewed the radiologic studies      ED Course:  Patient's stay here was uneventful and not sure of the nature of her presentation which is to his description of rapid onset of dementia. However is difficult to make that diagnostic decision based on a brief emergency department stay. Patient does not appear to have any focal deficits and indicative of an acute cerebrovascular accident and the workup for altered mental status and generalized weakness is extensive. The patient's family is clearly having difficulty taking care of her at home and she is a fall risk. The patient's case was reviewed with the hospitalist team, further disposition and management depends upon her evaluation.    Assessment:  Altered mental status Ataxia  Final Clinical Impression:  Final diagnoses:  Altered mental status, unspecified altered mental status type     Plan: * Inpatient            Daymon Larsen, MD 11/15/15 [phone]

## 2015-11-15 NOTE — Consult Note (Signed)
Reason for Consult: falls Referring Physician: Dr. Oneita Jolly Misty Hernandez is an 81 y.o. female.  HPI:  81 yo RHD F presents to [facility] after a rapid decline in cognition and multiple falls.  Per family, pt started to develop dizziness in the summer.  After the dizziness, pt has had multiple falls which have been increasing lately.  Also pt used to drive and take care of all of her ADLs but over the past month has required help for everything.  Pt often confuses family members now and gets lost.  She has also had a decline in ambulation due to dizziness which is mainly postural in nature without vertigo symptoms.  Pt was seen by Dr. Melrose Nakayama but family is not sure of work up.  Past Medical History  Diagnosis Date  . GERD (gastroesophageal reflux disease)   . Hyperlipidemia   . Hypertension     History reviewed. No pertinent past surgical history.  Family History  Problem Relation Age of Onset  . Hypertension Other     Social History:  reports that she has never smoked. She does not have any smokeless tobacco history on file. She reports that she does not drink alcohol or use illicit drugs.  Allergies: No Known Allergies  Medications: personally reviewed by me  Results for orders placed or performed during the hospital encounter of 11/14/15 (from the past 48 hour(s))  TSH     Status: None   Collection Time: 11/14/15  9:03 PM  Result Value Ref Range   TSH 1.747 0.350 - 4.500 uIU/mL  Comprehensive metabolic panel     Status: Abnormal   Collection Time: 11/14/15  9:03 PM  Result Value Ref Range   Sodium 137 135 - 145 mmol/L   Potassium 3.7 3.5 - 5.1 mmol/L   Chloride 98 (L) 101 - 111 mmol/L   CO2 29 22 - 32 mmol/L   Glucose, Bld 114 (H) 65 - 99 mg/dL   BUN 23 (H) 6 - 20 mg/dL   Creatinine, Ser 1.04 (H) 0.44 - 1.00 mg/dL   Calcium 9.2 8.9 - 10.3 mg/dL   Total Protein 6.9 6.5 - 8.1 g/dL   Albumin 4.4 3.5 - 5.0 g/dL   AST 19 15 - 41 U/L   ALT 9 (L) 14 - 54 U/L   Alkaline  Phosphatase 89 38 - 126 U/L   Total Bilirubin 0.6 0.3 - 1.2 mg/dL   GFR calc non Af Amer 49 (L) >60 mL/min   GFR calc Af Amer 57 (L) >60 mL/min    Comment: (NOTE) The eGFR has been calculated using the CKD EPI equation. This calculation has not been validated in all clinical situations. eGFR's persistently <60 mL/min signify possible Chronic Kidney Disease.    Anion gap 10 5 - 15  CBC with Differential/Platelet     Status: None   Collection Time: 11/14/15  9:03 PM  Result Value Ref Range   WBC 8.3 3.6 - 11.0 K/uL   RBC 4.72 3.80 - 5.20 MIL/uL   Hemoglobin 14.4 12.0 - 16.0 g/dL   HCT 42.8 35.0 - 47.0 %   MCV 90.6 80.0 - 100.0 fL   MCH 30.4 26.0 - 34.0 pg   MCHC 33.5 32.0 - 36.0 g/dL   RDW 13.8 11.5 - 14.5 %   Platelets 294 150 - 440 K/uL   Neutrophils Relative % 71 %   Neutro Abs 5.9 1.4 - 6.5 K/uL   Lymphocytes Relative 20 %   Lymphs Abs  1.7 1.0 - 3.6 K/uL   Monocytes Relative 7 %   Monocytes Absolute 0.5 0.2 - 0.9 K/uL   Eosinophils Relative 2 %   Eosinophils Absolute 0.2 0 - 0.7 K/uL   Basophils Relative 0 %   Basophils Absolute 0.0 0 - 0.1 K/uL  Urinalysis complete, with microscopic ([facility] only)     Status: Abnormal   Collection Time: 11/14/15  9:03 PM  Result Value Ref Range   Color, Urine YELLOW (A) YELLOW   APPearance HAZY (A) CLEAR   Glucose, UA NEGATIVE NEGATIVE mg/dL   Bilirubin Urine NEGATIVE NEGATIVE   Ketones, ur NEGATIVE NEGATIVE mg/dL   Specific Gravity, Urine 1.018 1.005 - 1.030   Hgb urine dipstick 1+ (A) NEGATIVE   pH 5.0 5.0 - 8.0   Protein, ur NEGATIVE NEGATIVE mg/dL   Nitrite NEGATIVE NEGATIVE   Leukocytes, UA TRACE (A) NEGATIVE   RBC / HPF TOO NUMEROUS TO COUNT 0 - 5 RBC/hpf   WBC, UA 0-5 0 - 5 WBC/hpf   Bacteria, UA MANY (A) NONE SEEN   Squamous Epithelial / LPF 0-5 (A) NONE SEEN   Mucous PRESENT    Hyaline Casts, UA PRESENT   Troponin I     Status: None   Collection Time: 11/14/15  9:03 PM  Result Value Ref Range   Troponin I <0.03 <0.031  ng/mL    Comment:        NO INDICATION OF MYOCARDIAL INJURY.   CBC     Status: None   Collection Time: 11/15/15  2:43 AM  Result Value Ref Range   WBC 7.2 3.6 - 11.0 K/uL   RBC 4.45 3.80 - 5.20 MIL/uL   Hemoglobin 13.5 12.0 - 16.0 g/dL   HCT 39.9 35.0 - 47.0 %   MCV 89.6 80.0 - 100.0 fL   MCH 30.4 26.0 - 34.0 pg   MCHC 33.9 32.0 - 36.0 g/dL   RDW 13.7 11.5 - 14.5 %   Platelets 223 150 - 440 K/uL  Creatinine, serum     Status: Abnormal   Collection Time: 11/15/15  2:43 AM  Result Value Ref Range   Creatinine, Ser 0.92 0.44 - 1.00 mg/dL   GFR calc non Af Amer 57 (L) >60 mL/min   GFR calc Af Amer >60 >60 mL/min    Comment: (NOTE) The eGFR has been calculated using the CKD EPI equation. This calculation has not been validated in all clinical situations. eGFR's persistently <60 mL/min signify possible Chronic Kidney Disease.     Ct Head Wo Contrast  11/14/2015  CLINICAL DATA:  Effusion EXAM: CT HEAD WITHOUT CONTRAST TECHNIQUE: Contiguous axial images were obtained from the base of the skull through the vertex without intravenous contrast. COMPARISON:  None. FINDINGS: Bony calvarium is intact. No findings to suggest acute hemorrhage, acute infarction or space-occupying mass lesion are noted. A small area decreased attenuation is noted in the anterior horn of the left internal capsule consistent with prior lacunar infarct. IMPRESSION: Chronic ischemic change without evidence of acute abnormality. Electronically Signed   By: Inez Catalina M.D.   On: 11/14/2015 21:29    Review of Systems  Constitutional: Negative.   HENT: Negative.   Eyes: Negative.   Respiratory: Negative.   Cardiovascular: Negative.   Gastrointestinal: Negative.   Genitourinary: Negative.   Musculoskeletal: Negative.   Skin: Negative.   Neurological: Positive for dizziness. Negative for tingling, tremors, sensory change, speech change, focal weakness, seizures and loss of consciousness.  Psychiatric/Behavioral:  Negative.  Blood pressure 151/56, pulse 59, temperature 98.3 F (36.8 C), temperature source Oral, resp. rate 15, height _0  (1.651 m), weight 85.821 kg (189 lb 3.2 oz), SpO2 96 %. Physical Exam  Constitutional: She appears well-developed and well-nourished. No distress.  HENT:  Head: Normocephalic and atraumatic.  Right Ear: External ear normal.  Left Ear: External ear normal.  Nose: Nose normal.  Mouth/Throat: Oropharynx is clear and moist.  Eyes: Conjunctivae and EOM are normal. Pupils are equal, round, and reactive to light. No scleral icterus.  Neck: Normal range of motion. Neck supple.  Cardiovascular: Normal rate, regular rhythm, normal heart sounds and intact distal pulses.   No murmur heard. Respiratory: Effort normal and breath sounds normal. No respiratory distress.  GI: Soft. Bowel sounds are normal. She exhibits no distension.  Musculoskeletal: Normal range of motion. She exhibits no edema.  Neurological:  A+O to person only, nl speech and langauge, some difficulty with complex thoughts, MMSE 20/28 in which she missed distant recall and concentration PERRLA, EOMI, nl VF, face symmetric, tongue midline 5-/5 B, nl tone, no tremor FTN WNL 1+/4 B, mute plantars Pin and temp intact B  Skin: Skin is warm. She is not diaphoretic.  Psychiatric: Her mood appears anxious. Her speech is delayed.   CT of head shows mild atrophy and white matter changes  Assessment/Plan: 1.  Encephalopathy-  Etiology is unclear at this point but this could explain rapid onset confusion and some of the gait problems as well;  Differential includes seizure as well too and rapid onset dementia 2.  Falls-  Likely related to 1. But could be other etiology as well -  MRI of brain -  EEG -  Check orthostatics -  Check ESR, CRP, B12/folate, Vit A and E, ammonia -  Needs physical therapy evaluation -  Will follow  Misty Hernandez 11/15/2015, 1:24 PM

## 2015-11-15 NOTE — Evaluation (Signed)
Physical Therapy Evaluation Patient Details Name: Misty Hernandez MRN: [id number] DOB: [birth year] Today's Date: 11/15/2015   History of Present Illness  Pt is an 81yo white female who came to [facility] after progressively worsening dizzy spells and gait instability that started around mid November. Pt experienced a fall around Thanksgiving that resulted in a L wrist fracture (still in cast at eval). Pt has been receiving in home PT and OT since, but does not  report any significant progress in her mobility and CC of diziness. She has had 24/7 supervision from family since that episode. Family is unable to report any clear medication changes for the last 3 months. PMH includes Afib. Pt denies spinning sensations, loss of vision, or changes to hearing with episodes, and reports that usually take ~30 minutes to resolve once she sits. She denies knee buckling or LOC, reports that she just feels unsteady.   Clinical Impression  Pt is received up in chair with family in room upon entry, awake, alert, and willing to participate. No acute distress noted. Pt is A&Ox3 and pleasant. Limited details from family or patient regarding changes to medications in the last 3 months, however subjective report does not sound clearly related to classic presentation of arrhythmia related syncope, further supported in the presence of normal orthostatic vitals at eval, and ataxic gait in the absence of exacerbated dizziness. All lab values are unremarkable at evaluation. Pt O2sats are WNL and patient denies use of supplemental O2 at home. Pt demonstrating moderately wide-based gait with moderate ataxia with ambulation, worse with turns. Pt experiencing easy LOB with changes in head position, but not with eyes closed. Fine and gross motor coordination are mildly impaired in BUE with testing of rapid alternating movements, finger-to-nose, and digital opposition. Of note, family reporting that patients gait has progressively worsened over  the last 3 weeks, from [facility] use to requiring RW for all distances for stability, concerning for neurological deficits still progressing. Patient presenting with impairment of strength, balance, and activity tolerance, limiting ability to perform ADL and mobility tasks at  baseline level of function. Patient will benefit from skilled intervention to address the above impairments and limitations, in order to restore to prior level of function, improve patient safety upon discharge, and to decrease falls risk. Recommending DC to home with continued 24/7 supervision, and continuance of HHPT/HHOT as previously arranged.        Follow Up Recommendations Home health PT    Equipment Recommendations  None recommended by PT    Recommendations for Other Services       Precautions / Restrictions Precautions Precautions: Fall Precaution Comments: no falls score available at evaluation; pt with multiple LOB during evaluation.  Required Braces or Orthoses:  (Pt is wearing a hard cast on L antebrachium. ) Restrictions Weight Bearing Restrictions: No      Mobility  Bed Mobility               General bed mobility comments: Received up in chair.   Transfers Overall transfer level: Needs assistance Equipment used: Rolling walker (2 wheeled) Transfers: Sit to/from Stand Sit to Stand: Min assist         General transfer comment: Requires minA from chair for safety cues and stabilization of AD; Does nto demonstrate fluency with RW for transfers, not utilized safely, requires VC to refrain from 'flopping' into chair.   Ambulation/Gait Ambulation/Gait assistance: Mod assist;Min guard Ambulation Distance (Feet): 100 Feet Assistive device: Rolling walker (2 wheeled)   Gait  velocity: very slow, inappropriate for gait speed assessment; family attests as typical gait.  Gait velocity interpretation: <1.8 ft/sec, indicative of risk for recurrent falls General Gait Details: Pt stands very erect,  walking on knees in TKE bilat, with hard, forceful initial-contact; multiple instances of LOB, some requiring modA to recover; patient denies weakness in legs or worsening dizziness; step length is ~4 inches bilat. Most challenge to stability occur during turns c walker.   Stairs            Wheelchair Mobility    Modified Rankin (Stroke Patients Only)       Balance Overall balance assessment: History of Falls;Needs assistance   Sitting balance-Leahy Scale: Good       Standing balance-Leahy Scale: Poor Standing balance comment: inappropriate for more in-depth high-level balance testing due to profound limitations in stability. No LOB c  Rhomberg; multiple LOB c head turns, extesion, and flexion, with slight delayed awarness of falling.              High level balance activites: Turns High Level Balance Comments: Turns are extrememly provocative to stability.              Pertinent Vitals/Pain Pain Assessment: No/denies pain    Home Living Family/patient expects to be discharged to:: Private residence Living Arrangements: Alone Available Help at Discharge: Family Type of Home: House Home Access: Stairs to enter Entrance Stairs-Rails: Right Entrance Stairs-Number of Steps: 3 Home Layout: One level Home Equipment: [place] - 2 wheels;Cane - single point;Crutches (platform walker. )      Prior Function Level of Independence: Needs assistance   Gait / Transfers Assistance Needed: 1 month ago, community ambulation; 1 week ago, household distances with RW, with intermittent episodic gait instability.   ADL's / Homemaking Assistance Needed: Pt previously requiring assistance from family with groceries, and bathing (mostly due to L arm cast).         Hand Dominance   Dominant Hand: Right    Extremity/Trunk Assessment   Upper Extremity Assessment: RUE deficits/detail;LUE deficits/detail RUE Deficits / Details: Labored and slow serial digital opposition;  handwiritng is free of micrographia; grip is WNL; rapid alternating movements is normal on this side, 5x finger to nose is slow, but free of dysdiadochokinesia.       LUE Deficits / Details: serial digital opposition is slow, but requires less effort than R side; 5x finger to nose is ataxic (promially> distally); Rapid alteranting movements are impaired on LUE;    Lower Extremity Assessment: Generalized weakness (impairment mostly seen during gait. )      Cervical / Trunk Assessment:  (Very errect, upright posture, does not flex/extend neck WNL when cued; Rotation ROM is WFL ~70 degrees bilat. )  Communication   Communication: No difficulties  Cognition Arousal/Alertness: Awake/alert Behavior During Therapy: WFL for tasks assessed/performed Overall Cognitive Status: Within Functional Limits for tasks assessed                      General Comments      Exercises        Assessment/Plan    PT Assessment Patient needs continued PT services  PT Diagnosis Difficulty walking;Abnormality of gait   PT Problem List Decreased knowledge of use of DME;Decreased range of motion;Decreased balance;Decreased mobility;Decreased coordination  PT Treatment Interventions Balance training;Neuromuscular re-education;Therapeutic activities;Functional mobility training;Gait training;Stair training;DME instruction;Patient/family education   PT Goals (Current goals can be found in the Care Plan section) Acute Rehab PT Goals Patient  Stated Goal: Return to prior independence in community mobility and driving.  PT Goal Formulation: With patient/family Time For Goal Achievement: 11/29/15 Potential to Achieve Goals: Fair    Frequency Min 2X/week   Barriers to discharge Inaccessible home environment 3 stairs     Co-evaluation               End of Session Equipment Utilized During Treatment: Gait belt Activity Tolerance: Patient tolerated treatment well Patient left: in chair;with call  bell/phone within reach;with family/visitor present Nurse Communication: Other (comment) (Nursing just assessed orthostatic vitals, which are WNL. )    Functional Assessment Tool Used: Clinical Judgment  Functional Limitation: Mobility: Walking and moving around Mobility: Walking and Moving Around Current Status [id number]): At least 80 percent but less than 100 percent impaired, limited or restricted Mobility: Walking and Moving Around Goal Status [phone]): At least 80 percent but less than 100 percent impaired, limited or restricted    Time: 1110-1140 PT Time Calculation (min) (ACUTE ONLY): 30 min   Charges:   PT Evaluation $Initial PT Evaluation Tier I: 1 Procedure     PT G Codes:   PT G-Codes **NOT FOR INPATIENT CLASS** Functional Assessment Tool Used: Clinical Judgment  Functional Limitation: Mobility: Walking and moving around Mobility: Walking and Moving Around Current Status [id number]): At least 80 percent but less than 100 percent impaired, limited or restricted Mobility: Walking and Moving Around Goal Status [id number]): At least 80 percent but less than 100 percent impaired, limited or restricted    Tene Gato C 11/15/2015, 12:29 PM  12:46 PM  Etta Grandchild, PT, DPT [place] License # [id number]

## 2015-11-15 NOTE — Progress Notes (Signed)
rn spoke with dr Verdell Carmine re; pt reporting dizziness when standing. Over 1 month hx np cough. Pt was seen by speech therapy  At [facility]  In the recent past . md to f/u with pt and dtr. I/s rn to assess orthostatic vs

## 2015-11-16 ENCOUNTER — Encounter
Admission: RE | Admit: 2015-11-16 | Discharge: 2015-11-16 | Disposition: A | Discharge status: Home / Self Care | Payer: Medicare Other | Source: Ambulatory Visit | Attending: Internal Medicine | Admitting: Internal Medicine

## 2015-11-16 LAB — VITAMIN D 25 HYDROXY (VIT D DEFICIENCY, FRACTURES): Vit D, 25-Hydroxy: 35.2 ng/mL (ref 30.0–100.0)

## 2015-11-16 NOTE — OR Nursing (Signed)
0839 dose Eliquis, LP can not be done for 48 hours. Floor nurse notified, she will let md know.

## 2015-11-16 NOTE — Clinical Social Work Note (Signed)
Clinical Social Work Assessment  Patient Details  Name: Misty Hernandez MRN: [id number] Date of Birth: Jun 20, 1932  Date of referral:  11/16/15               Reason for consult:  Facility Placement                Permission sought to share information with:  Family Supports Permission granted to share information::  Yes, Verbal Permission Granted  Name::     daughter, [name] and daughter/HCPOA, Piper   Housing/Transportation Living arrangements for the past 2 months:  Single Family Home Source of Information:  Patient, Adult Children Patient Interpreter Needed:  None Criminal Activity/Legal Involvement Pertinent to Current Situation/Hospitalization:  No - Comment as needed Significant Relationships:  Adult Children, Other Family Members Lives with:  Self Do you feel safe going back to the place where you live?  Yes Need for family participation in patient care:  Yes (Comment)  Care giving concerns:  Family has been providing 24 hour care and is no longer able to maintain level needed as pt has had a significant decline in the past 5 weeks.    Social Worker assessment / plan:  CSW met with pt's family to address consult. CSW introduced herself and explained role of social work. CSW also explained the process of discharging to SNF with a managed Medicare.   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 CSW provided supportive counseling to pt's daughter. Pt has had a significant declined in functioning over the past 5 weeks. Pt's family would like to pt to go to SNF to rebuild strenght. CSW initiated SNF search and provided bed offers. Pt will [facility] to [facility].   CSW will continue to follow.   Employment status:  Retired Nurse, adult PT Recommendations:  Home with [place] / Referral to community resources:  [place]  Patient/Family's Response to care:  Pt's family was appreciative of CSW support.    Patient/Family's Understanding of and Emotional Response to Diagnosis, Current Treatment, and Prognosis:  Pt's family understands that pt needs a higher level of care.   Emotional Assessment Appearance:  Appears stated age Attitude/Demeanor/Rapport:  Other (Appropriate) Affect (typically observed):  Accepting, Adaptable Orientation:  Oriented to Self, Oriented to Place, Oriented to Situation Alcohol / Substance use:  Never Used Psych involvement (Current and /or in the community):  No (Comment)  Discharge Needs  Concerns to be addressed:  Adjustment to Illness Readmission within the last 30 days:  No Current discharge risk:  Other (readmission if discharged home. ) Barriers to Discharge:  No Barriers Identified   Haily Caley, LCSW 11/16/2015, 5:20 PM

## 2015-11-16 NOTE — Progress Notes (Signed)
NEUROLOGY NOTE  S: Pt more aware today, daughter is at bedside  ROS unobtainable secondary to confusion  O: 98.1   176/55   66    18 Nl weight, NAD Normocephalic, oropharynx clear Supple, no JVD CTA B, no wheezing RRR, no murmurs No C/C/E  A+Ox2 not all of time, no startle, mild dysarthria PERRLA, EOMI, face symmetric 5/5 B, nl tone  MRI shows bilateral mesial temporal changes as well as caudate  EEG shows mild slowing but no seizure activity  A/P: 1. Encephalopathy-  This could be sporadic CJD by history and MRI but the imaging is not 100% and the EEG is not the typical pattern either.  This could be limbic encephalitis or autoimmune non-paraneoplastic encephalopathy as well in differential. -  LP under flouro -  Trial of solumedrol 1gm IV daily if LP shows no WBCs -  Needs CSF for all basic labs plus tau proteins and 14-3-3 to test for CJD -  Will send thyroglobulin ab but pt needs paraneoplastic panel as well -  Will sign out to Dr. Earnest Conroy and family informed of plan

## 2015-11-16 NOTE — NC FL2 (Signed)
[place] LEVEL OF CARE SCREENING TOOL     IDENTIFICATION  Patient Name: Misty Hernandez Birthdate: 09-23-1932 Sex: female Admission Date (Current Location): 11/14/2015  [place] and [place] Number:  Building services engineer and Address:  [facility], [address] [place], [place] [address]      Provider Number: [id number]  Attending Physician Name and Address:  Henreitta Leber, MD  Relative Name and Phone Number:     Garfield Cornea [phone]  Current Level of Care: Hospital Recommended Level of Care: [place] Prior Approval Number:    Date Approved/Denied:   PASRR Number:    Discharge Plan: SNF    Current Diagnoses: Patient Active Problem List   Diagnosis Date Noted  . Frequent falls 11/15/2015    Orientation RESPIRATION BLADDER Height & Weight    Self, Time, Situation   Room air Incontinent   189 lbs. 5'5"  BEHAVIORAL SYMPTOMS/MOOD NEUROLOGICAL BOWEL NUTRITION STATUS      Incontinent Diet  AMBULATORY STATUS COMMUNICATION OF NEEDS Skin   Extensive Assist Verbally Normal                       Personal Care Assistance Level of Assistance  Bathing, Feeding, Dressing Bathing Assistance: Maximum assistance Feeding assistance: Maximum assistance Dressing Assistance: Maximum assistance     Functional Limitations Info  Sight, Hearing, Speech Sight-Glasses Hearing Info: Adequate Speech Info: Adequate    SPECIAL CARE FACTORS FREQUENCY  PT (By licensed PT)     PT Frequency: x5              Contractures      Additional Factors Info   Needs assistance in/out of bed               Current Medications (11/16/2015):  This is the current hospital active medication list Current Facility-Administered Medications  Medication Dose Route Frequency Provider Last Rate Last Dose  . acetaminophen (TYLENOL) tablet 650 mg  650 mg Oral Q6H PRN Lytle Butte, MD   650 mg at 11/15/15  1254   Or  . acetaminophen (TYLENOL) suppository 650 mg  650 mg Rectal Q6H PRN Lytle Butte, MD      . hydrALAZINE (APRESOLINE) injection 10 mg  10 mg Intravenous Q6H PRN Henreitta Leber, MD      . meclizine (ANTIVERT) tablet 12.5 mg  12.5 mg Oral TID PRN Henreitta Leber, MD   12.5 mg at 11/16/15 1533  . metoprolol tartrate (LOPRESSOR) tablet 25 mg  25 mg Oral BID Lytle Butte, MD   25 mg at 11/16/15 [id number]  . multivitamin with minerals tablet 1 tablet  1 tablet Oral Q supper Lytle Butte, MD   1 tablet at 11/16/15 1533  . omega-3 acid ethyl esters (LOVAZA) capsule 1 g  1 g Oral Daily Lytle Butte, MD   1 g at 11/16/15 [phone]  . ondansetron (ZOFRAN) tablet 4 mg  4 mg Oral Q6H PRN Lytle Butte, MD       Or  . ondansetron [facility]) injection 4 mg  4 mg Intravenous Q6H PRN Lytle Butte, MD      . oxyCODONE (Oxy IR/ROXICODONE) immediate release tablet 5 mg  5 mg Oral Q4H PRN Lytle Butte, MD   5 mg at 11/16/15 1533  . pantoprazole (PROTONIX) EC tablet 40 mg  40 mg Oral Q breakfast Lytle Butte, MD   40 mg  at 11/16/15 0839  . vitamin B-12 (CYANOCOBALAMIN) tablet 1,000 mcg  1,000 mcg Oral Daily Lytle Butte, MD   1,000 mcg at 11/16/15 [id number]     Discharge Medications: Please see discharge summary for a list of discharge medications.  Relevant Imaging Results:  Relevant Lab Results:   Additional Information  SSN # [id number]  Joana Reamer, [place]

## 2015-11-16 NOTE — Care Management (Signed)
Met with patient, friend Earlie Server and one of her daughters that works for [employer]. They have a bed hold at [facility]. No RNCM needs.

## 2015-11-16 NOTE — Care Management Obs Status (Signed)
[place] NOTIFICATION   Patient Details  Name: Misty Hernandez MRN: [id number] Date of Birth: Jun 02, 1932   Medicare Observation Status Notification Given:  Yes    Marshell Garfinkel, RN 11/16/2015, 8:02 AM

## 2015-11-16 NOTE — Progress Notes (Signed)
[name] [facility] [place] NAME: Misty Hernandez    MR#:  [id number]  DATE OF BIRTH:  1931-12-31  SUBJECTIVE:   Patient here due to recurrent falls and dizziness. Feels a bit better today. Family at bedside.  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   REVIEW OF SYSTEMS:    Review of Systems  Constitutional: Negative for fever and chills.  HENT: Negative for congestion and tinnitus.   Eyes: Negative for blurred vision and double vision.  Respiratory: Negative for cough, shortness of breath and wheezing.   Cardiovascular: Negative for chest pain, orthopnea and PND.  Gastrointestinal: Negative for nausea, vomiting, abdominal pain and diarrhea.  Genitourinary: Negative for dysuria and hematuria.  Musculoskeletal: Positive for falls.  Neurological: Positive for dizziness. Negative for sensory change and focal weakness.  All other systems reviewed and are negative.   Nutrition: Heart healthy Tolerating Diet: Yes Tolerating PT: Eval Noted.    DRUG ALLERGIES:  No Known Allergies  VITALS:  Blood pressure 176/55, pulse 66, temperature 98 F (36.7 C), temperature source Oral, resp. rate 18, height 5\' 5"  (1.651 m), weight 85.821 kg (189 lb 3.2 oz), SpO2 95 %.  PHYSICAL EXAMINATION:   Physical Exam  GENERAL:  81 y.o.-year-old patient lying in the bed with no acute distress.  EYES: Pupils equal, round, reactive to light and accommodation. No scleral icterus. Extraocular muscles intact.  HEENT: Head atraumatic, normocephalic. Oropharynx and nasopharynx clear.  NECK:  Supple, no jugular venous distention. No thyroid enlargement, no tenderness.  LUNGS: Normal breath sounds bilaterally, no wheezing, rales, rhonchi. No use of accessory muscles of respiration.  CARDIOVASCULAR: S1, S2 normal. No murmurs, rubs, or gallops.  ABDOMEN: Soft, nontender, nondistended. Bowel sounds present. No organomegaly or mass.  EXTREMITIES: No cyanosis, clubbing or  edema b/l.  Left arm in a cast.    NEUROLOGIC: Cranial nerves II through XII are intact. No focal Motor or sensory deficits b/l.   PSYCHIATRIC: The patient is alert and oriented x 3. Good affect.  SKIN: No obvious rash, lesion, or ulcer.    LABORATORY PANEL:   CBC  Recent Labs Lab 11/15/15 0243  WBC 7.2  HGB 13.5  HCT 39.9  PLT 223   ------------------------------------------------------------------------------------------------------------------  Chemistries   Recent Labs Lab 11/14/15 2103 11/15/15 0243  NA 137  --   K 3.7  --   CL 98*  --   CO2 29  --   GLUCOSE 114*  --   BUN 23*  --   CREATININE 1.04* 0.92  CALCIUM 9.2  --   AST 19  --   ALT 9*  --   ALKPHOS 89  --   BILITOT 0.6  --    ------------------------------------------------------------------------------------------------------------------  Cardiac Enzymes  Recent Labs Lab 11/14/15 2103  TROPONINI <0.03   ------------------------------------------------------------------------------------------------------------------  RADIOLOGY:  Ct Head Wo Contrast  11/14/2015  CLINICAL DATA:  Effusion EXAM: CT HEAD WITHOUT CONTRAST TECHNIQUE: Contiguous axial images were obtained from the base of the skull through the vertex without intravenous contrast. COMPARISON:  None. FINDINGS: Bony calvarium is intact. No findings to suggest acute hemorrhage, acute infarction or space-occupying mass lesion are noted. A small area decreased attenuation is noted in the anterior horn of the left internal capsule consistent with prior lacunar infarct. IMPRESSION: Chronic ischemic change without evidence of acute abnormality. Electronically Signed   By: Inez Catalina M.D.   On: 11/14/2015 21:29   Mr Brain Wo Contrast  11/15/2015  CLINICAL DATA:  Multiple falls. Difficulty ambulating. Dizzy sensation. Atrial fibrillation. EXAM: MRI HEAD WITHOUT CONTRAST TECHNIQUE: Multiplanar, multiecho pulse sequences of the brain and  surrounding structures were obtained without intravenous contrast. COMPARISON:  CT head without contrast 11/14/2015. FINDINGS: Symmetric restricted diffusion is present in the caudate nuclei and thalami bilaterally. There is minimal diffusion change in the lentiform nuclei. Changes extend into the body of the hippocampus bilaterally . T2 and FLAIR changes are associated with the same areas. Mild generalized atrophy and scattered white matter disease is also present. There is no acute hemorrhage or mass lesion. Flow is present in the major intracranial arteries. The brainstem and cerebellum are within normal limits. The globes orbits are intact. The paranasal sinuses and mastoid air cells are clear. The skullbase is within normal limits. Midline sagittal images are unremarkable. IMPRESSION: 1. Symmetric restricted diffusion within the caudate lobe, thalami, and hippocampal structures bilaterally. There is some involvement of the lentiform nucleus bilaterally as well. This configuration can be seen in the setting of Creutzfeldt-Jakob disease (CJD). The differential diagnosis includes hypoxic/anoxic brain injury or hypoglycemic encephalopathy. 2. Mild age advanced atrophy and white matter disease. 3. These results were called by telephone at the time of interpretation on 11/15/2015 at 6:01 pm to Dr. Posey Pronto, who verbally acknowledged these results. Electronically Signed   By: San Morelle M.D.   On: 11/15/2015 18:01     ASSESSMENT AND PLAN:   81 year old female with past history of chronic atrial fibrillation, HTN, Hyperlipidemia, who presents to the hospital due to recurrent falls and dizziness.  #1 recurrent falls/dizziness-etiology unclear presently. Patient apparently 2 months ago was driving and living independently. Patient CT head on admission is negative. She had an MRI of the brain at [facility] about a month or so ago which was also negative. -Appreciate neurology input and MRI yesterday showing  possible CJD.  Had EEG today which was not consistent w/ CJD.  - needs LP and CSF testing for tau proteins, and also for 14-3-3 for CJD.  Cannot be done now as pt. Was on Eliquis which I will hold for the next 2 days.  - cont. Supportive care for now.   #2 chronic atrial fibrillation-rate controlled. Continue metoprolol. - hold Eliquis and pt. Will need fluoro guided LP.    #3 GERD-continue Protonix.  #4 hypertension-continue metoprolol.   All the records are reviewed and case discussed with Care Management/Social Workerr. Management plans discussed with the patient, family and they are in agreement.  CODE STATUS: Full  DVT Prophylaxis: Eliquis  TOTAL TIME TAKING CARE OF THIS PATIENT: 35 minutes.   POSSIBLE D/C IN 1-2 DAYS, DEPENDING ON CLINICAL CONDITION.  Greater than 50% of time spent in coordination of care and discussion with patient, family and also the neurologist.   Henreitta Leber M.D on 11/16/2015 at 2:45 PM  Between 7am to 6pm - Pager - [phone]  After 6pm go to [email or web address] - password [email or web address] [facility]  [place] [facility]  Office  [phone]  CC: Primary care physician; [place] Pink, MD

## 2015-11-17 DIAGNOSIS — A8100 Creutzfeldt-Jakob disease, unspecified: Secondary | ICD-10-CM

## 2015-11-17 DIAGNOSIS — R296 Repeated falls: Secondary | ICD-10-CM | POA: Diagnosis not present

## 2015-11-17 DIAGNOSIS — R4182 Altered mental status, unspecified: Secondary | ICD-10-CM | POA: Diagnosis not present

## 2015-11-17 LAB — SEDIMENTATION RATE: SED RATE: 10 mm/h (ref 0–30)

## 2015-11-17 LAB — VITAMIN B1: Vitamin B1 (Thiamine): 193.3 nmol/L (ref 66.5–200.0)

## 2015-11-17 LAB — THYROID ANTIBODIES
Thyroglobulin Antibody: <1 IU/mL (ref 0.0–0.9)
Thyroperoxidase Ab SerPl-aCnc: <6 IU/mL (ref 0–34)

## 2015-11-17 LAB — ZINC: ZINC: 68 ug/dL (ref 56–134)

## 2015-11-17 MED ORDER — MAGNESIUM HYDROXIDE 400 MG/5ML PO SUSP
30.0000 mL | Freq: Every day | ORAL | Status: DC | PRN
  Administered 2015-11-17: 30 mL via ORAL

## 2015-11-17 MED ORDER — BISACODYL 10 MG RE SUPP
10.0000 mg | Freq: Every day | RECTAL | Status: DC | PRN
  Administered 2015-11-21: 10 mg via RECTAL
  Filled 2015-11-17: qty 1

## 2015-11-17 MED ORDER — SODIUM CHLORIDE 0.9 % IV BOLUS (SEPSIS): 1000.0000 mL | Freq: Once | INTRAVENOUS | Status: DC

## 2015-11-17 MED ORDER — SODIUM CHLORIDE 0.9 % IV SOLN
INTRAVENOUS | Status: DC
  Administered 2015-11-17: 20:00:00 via INTRAVENOUS

## 2015-11-17 NOTE — Plan of Care (Signed)
Problem: Safety: Goal: Ability to remain free from injury will improve Outcome: Progressing Free from falls this shift.

## 2015-11-17 NOTE — Progress Notes (Addendum)
[facility] [place] NAME: Misty Hernandez    MR#:  [id number]  DATE OF BIRTH:  1932/10/17  SUBJECTIVE:   Patient here due to recurrent falls and dizziness. Didn't sleep well last night.  Constipated. Family at bedside.  MRI Brain consistent w/ CJD.   REVIEW OF SYSTEMS:    Review of Systems  Constitutional: Negative for fever and chills.  HENT: Negative for congestion and tinnitus.   Eyes: Negative for blurred vision and double vision.  Respiratory: Negative for cough, shortness of breath and wheezing.   Cardiovascular: Negative for chest pain, orthopnea and PND.  Gastrointestinal: Positive for constipation. Negative for nausea, vomiting, abdominal pain and diarrhea.  Genitourinary: Negative for dysuria and hematuria.  Musculoskeletal: Positive for falls.  Neurological: Negative for dizziness, sensory change and focal weakness.  All other systems reviewed and are negative.   Nutrition: Heart healthy Tolerating Diet: Yes Tolerating PT: Eval Noted.    DRUG ALLERGIES:  No Known Allergies  VITALS:  Blood pressure 178/55, pulse 58, temperature 98 F (36.7 C), temperature source Oral, resp. rate 18, height 5\' 5"  (1.651 m), weight 85.821 kg (189 lb 3.2 oz), SpO2 95 %.  PHYSICAL EXAMINATION:   Physical Exam  GENERAL:  81 y.o.-year-old patient lying in the bed with no acute distress.  EYES: Pupils equal, round, reactive to light and accommodation. No scleral icterus. Extraocular muscles intact.  HEENT: Head atraumatic, normocephalic. Oropharynx and nasopharynx clear.  NECK:  Supple, no jugular venous distention. No thyroid enlargement, no tenderness.  LUNGS: Normal breath sounds bilaterally, no wheezing, rales, rhonchi. No use of accessory muscles of respiration.  CARDIOVASCULAR: S1, S2 normal. No murmurs, rubs, or gallops.  ABDOMEN: Soft, nontender, nondistended. Bowel sounds present. No organomegaly or mass.  EXTREMITIES: No  cyanosis, clubbing or edema b/l.  Left arm in a cast.    NEUROLOGIC: Cranial nerves II through XII are intact. No focal Motor or sensory deficits b/l.   PSYCHIATRIC: The patient is alert and oriented x 3. Good affect.  SKIN: No obvious rash, lesion, or ulcer.    LABORATORY PANEL:   CBC  Recent Labs Lab 11/15/15 0243  WBC 7.2  HGB 13.5  HCT 39.9  PLT 223   ------------------------------------------------------------------------------------------------------------------  Chemistries   Recent Labs Lab 11/14/15 2103 11/15/15 0243  NA 137  --   K 3.7  --   CL 98*  --   CO2 29  --   GLUCOSE 114*  --   BUN 23*  --   CREATININE 1.04* 0.92  CALCIUM 9.2  --   AST 19  --   ALT 9*  --   ALKPHOS 89  --   BILITOT 0.6  --    ------------------------------------------------------------------------------------------------------------------  Cardiac Enzymes  Recent Labs Lab 11/14/15 2103  TROPONINI <0.03   ------------------------------------------------------------------------------------------------------------------  RADIOLOGY:  Mr Brain Wo Contrast  11/15/2015  CLINICAL DATA:  Multiple falls. Difficulty ambulating. Dizzy sensation. Atrial fibrillation. EXAM: MRI HEAD WITHOUT CONTRAST TECHNIQUE: Multiplanar, multiecho pulse sequences of the brain and surrounding structures were obtained without intravenous contrast. COMPARISON:  CT head without contrast 11/14/2015. FINDINGS: Symmetric restricted diffusion is present in the caudate nuclei and thalami bilaterally. There is minimal diffusion change in the lentiform nuclei. Changes extend into the body of the hippocampus bilaterally . T2 and FLAIR changes are associated with the same areas. Mild generalized atrophy and scattered white matter disease is also present. There is no acute hemorrhage or mass lesion. Flow is  present in the major intracranial arteries. The brainstem and cerebellum are within normal limits. The globes orbits  are intact. The paranasal sinuses and mastoid air cells are clear. The skullbase is within normal limits. Midline sagittal images are unremarkable. IMPRESSION: 1. Symmetric restricted diffusion within the caudate lobe, thalami, and hippocampal structures bilaterally. There is some involvement of the lentiform nucleus bilaterally as well. This configuration can be seen in the setting of Creutzfeldt-Jakob disease (CJD). The differential diagnosis includes hypoxic/anoxic brain injury or hypoglycemic encephalopathy. 2. Mild age advanced atrophy and white matter disease. 3. These results were called by telephone at the time of interpretation on 11/15/2015 at 6:01 pm to Dr. Posey Pronto, who verbally acknowledged these results. Electronically Signed   By: San Morelle M.D.   On: 11/15/2015 18:01     ASSESSMENT AND PLAN:   81 year old female with past history of chronic atrial fibrillation, HTN, Hyperlipidemia, who presents to the hospital due to recurrent falls and dizziness.  #1 recurrent falls/dizziness-etiology unclear presently. Patient apparently 2 months ago was driving and living independently. Patient CT head on admission is negative. She had an MRI of the brain at [facility] about a month or so ago which was also negative. -Appreciate neurology input and MRI here showing possible CJD.  Had EEG today which was not consistent w/ CJD.  - discussed w/ Neurology today and she needs an LP and they will discuss w/ IR to see if it can be done in the next few days as pt. Was on Eliquis.  - cont. Supportive care for now.   #2 chronic atrial fibrillation-rate controlled. Continue metoprolol. - hold Eliquis as pt. Will need fluoro guided LP.    #3 GERD-continue Protonix.  #4 hypertension-continue metoprolol.  #5 Constipation - will start on milk of mag, Dulcolax PRN.   All the records are reviewed and case discussed with Care Management/Social Workerr. Management plans discussed with the patient, family and  they are in agreement.  CODE STATUS: Full  DVT Prophylaxis: SCD's and TED's  TOTAL TIME TAKING CARE OF THIS PATIENT: 30 minutes.   POSSIBLE D/C IN 1-2 DAYS, DEPENDING ON CLINICAL CONDITION.   Henreitta Leber M.D on 11/17/2015 at 2:54 PM  Between 7am to 6pm - Pager - [phone]  After 6pm go to [email or web address] - password [email or web address] [facility]  [facility]  Office  [phone]  CC: Primary care physician; Maryland Pink, MD

## 2015-11-17 NOTE — Consult Note (Signed)
Reason for Consult: falls Referring Physician: Dr. Oneita Jolly Misty Hernandez is an 81 y.o. female.  HPI:  81 yo RHD F presents to [facility] after a rapid decline in cognition and multiple falls.  Per family, pt started to develop dizziness in the summer.  After the dizziness, pt has had multiple falls which have been increasing lately.  Also pt used to drive and take care of all of her ADLs but over the past month has required help for everything.  Pt often confuses family members now and gets lost.  She has also had a decline in ambulation due to dizziness which is mainly postural in nature without vertigo symptoms.  Pt was seen by Dr. Melrose Nakayama but family is not sure of work up.  Past Medical History  Diagnosis Date  . GERD (gastroesophageal reflux disease)   . Hyperlipidemia   . Hypertension     History reviewed. No pertinent past surgical history.  Family History  Problem Relation Age of Onset  . Hypertension Other     Social History:  reports that she has never smoked. She does not have any smokeless tobacco history on file. She reports that she does not drink alcohol or use illicit drugs.  Allergies: No Known Allergies  Medications: personally reviewed by me  Results for orders placed or performed during the hospital encounter of 11/14/15 (from the past 48 hour(s))  Zinc     Status: None   Collection Time: 11/16/15  3:58 AM  Result Value Ref Range   Zinc 68 56 - 134 ug/dL    Comment: (NOTE)                                Detection Limit = 5 Performed At: [facility] [place], [place] [id number] Lindon Romp MD [id number]   Thyroid antibodies     Status: None   Collection Time: 11/16/15  1:00 PM  Result Value Ref Range   Thyroperoxidase Ab SerPl-aCnc <6 0 - 34 IU/mL   Thyroglobulin Antibody <1.0 0.0 - 0.9 IU/mL    Comment: (NOTE) Thyroglobulin Antibody measured by [facility] Methodology Performed At: [facility] [place], [place] [id number] Lindon Romp MD ZP:[id number]     No results found.  Review of Systems  Constitutional: Negative.   HENT: Negative.   Eyes: Negative.   Respiratory: Negative.   Cardiovascular: Negative.   Gastrointestinal: Negative.   Genitourinary: Negative.   Musculoskeletal: Negative.   Skin: Negative.   Neurological: Positive for dizziness. Negative for tingling, tremors, sensory change, speech change, focal weakness, seizures and loss of consciousness.  Psychiatric/Behavioral: Negative.    Blood pressure 158/65, pulse 68, temperature 97.9 F (36.6 C), temperature source Oral, resp. rate 18, height 5' 5"  (1.651 m), weight 189 lb 3.2 oz (85.821 kg), SpO2 98 %. Physical Exam  Constitutional: She appears well-developed and well-nourished. No distress.  HENT:  Head: Normocephalic and atraumatic.  Right Ear: External ear normal.  Left Ear: External ear normal.  Nose: Nose normal.  Mouth/Throat: Oropharynx is clear and moist.  Eyes: Conjunctivae and EOM are normal. Pupils are equal, round, and reactive to light. No scleral icterus.  Neck: Normal range of motion. Neck supple.  Cardiovascular: Normal rate, regular rhythm, normal heart sounds and intact distal pulses.   No murmur heard. Respiratory: Effort normal and breath sounds normal. No respiratory distress.  GI: Soft. Bowel sounds  are normal. She exhibits no distension.  Musculoskeletal: Normal range of motion. She exhibits no edema.  Neurological:  A+O to person only, nl speech and langauge, some difficulty with complex thoughts, MMSE 20/28 in which she missed distant recall and concentration PERRLA, EOMI, nl VF, face symmetric, tongue midline 5-/5 B, nl tone, no tremor FTN WNL 1+/4 B, mute plantars Pin and temp intact B  Skin: Skin is warm. She is not diaphoretic.  Psychiatric: Her mood appears anxious. Her speech is delayed.   MRI diffusion restriction that is subcortical. Pt has been having  progressive decline in mental status and cognition. Based on examination this is likely neurodegenerative process.   - This could also be progressive Lewy body dementia or parkisnian plus syndrom like MSA, PSP( pt has difficulty looking up with is a cardinal sign, but can also be seen in elderly)  - Agree with LP which will be done by IR( case d/w) need to order LP for 14:3:3 to look for CJD as this is a very strong diagnosis.   - Will obtain CTA Head to look for vasculitis.  - ESR, CRP ordered - Agree with EEG on Monday - do not think this is a demyelinating disorder at this point - will hold of steroids.    Along with 14:3:3 Tau and RTQuic will be processed which is only done at [facility] in [place] ([facility] and process). Will attempt to call micro in AM.   The LP kit kit can't be re used after this pt due to strong suspicion of CJD   Leotis Pain 11/17/2015, 6:41 PM

## 2015-11-18 ENCOUNTER — Observation Stay: Payer: Medicare Other

## 2015-11-18 DIAGNOSIS — R4182 Altered mental status, unspecified: Secondary | ICD-10-CM | POA: Diagnosis not present

## 2015-11-18 DIAGNOSIS — R296 Repeated falls: Secondary | ICD-10-CM | POA: Diagnosis not present

## 2015-11-18 LAB — VITAMIN E: ALPHA-TOCOPHEROL: 13.1 mg/L (ref 6.5–21.5)

## 2015-11-18 LAB — RPR: RPR Ser Ql: NONREACTIVE

## 2015-11-18 LAB — C-REACTIVE PROTEIN

## 2015-11-18 LAB — HIGH SENSITIVITY CRP: CRP HIGH SENSITIVITY: 1.6 mg/L (ref 0.00–3.00)

## 2015-11-18 LAB — VITAMIN A: Vitamin A (Retinoic Acid): 77 ug/dL (ref 26–82)

## 2015-11-18 MED ORDER — IOHEXOL 350 MG/ML SOLN
75.0000 mL | Freq: Once | INTRAVENOUS | Status: AC | PRN
  Administered 2015-11-18: 75 mL via INTRAVENOUS

## 2015-11-18 NOTE — Progress Notes (Signed)
Physical Therapy Treatment Patient Details Name: YISEL SCHOOLMAN MRN: [id number] DOB: 10/01/1932 Today's Date: 11/18/2015    History of Present Illness Pt is an 81yo white female who came to [facility] after progressively worsening dizzy spells and gait instability that started around mid November. Pt experienced a fall around Thanksgiving that resulted in a L wrist fracture (still in cast at eval). Pt has been receiving in home PT and OT since, but does not  report any significant progress in her mobility and CC of diziness. She has had 24/7 supervision from family since that episode. Family is unable to report any clear medication changes for the last 3 months. PMH includes Afib. Pt denies spinning sensations, loss of vision, or changes to hearing with episodes, and reports that usually take ~30 minutes to resolve once she sits. She denies knee buckling or LOC, reports that she just feels unsteady.     PT Comments    Pt's family present and very supportive, excited to see her getting up and doing well.  Pt is able to walk nearly 200 ft (does have considerable fatigue, vitals remain appropriate).  Pt continues to have coordination issues and short choppy steps, but was much faster and more efficient than previous PT session.  Pt displays some minimal quality of motion issues with resisted LE exercises, but was able to make good adjustment with cuing.    Follow Up Recommendations  SNF (pt would not be safe at home given her coordination issues)     Equipment Recommendations       Recommendations for Other Services       Precautions / Restrictions Precautions Precautions: Fall Restrictions Weight Bearing Restrictions: No    Mobility  Bed Mobility               General bed mobility comments:  (Pt in recliner on arrival)  Transfers Overall transfer level: Needs assistance Equipment used: Rolling walker (2 wheeled) Transfers: Sit to/from Stand Sit to Stand: Min assist          General transfer comment: VCs for set up and hand placement, pt unable to fully rise on first attempt but needs only very little assist to keep weight forward on second attempt  Ambulation/Gait Ambulation/Gait assistance: Min guard;Min assist Ambulation Distance (Feet): 185 Feet Assistive device: Rolling walker (2 wheeled)   Gait velocity: pt continues to walk slower than community appropriate speeds (consistently 12-14 seconds for 10 ft walk)   General Gait Details: Pt remains reliant on full extension of knees to stay upright but was able to take longer and more consistent steps than previous ambulation bout.  She shows decreased LE control, but does not have any actaul LOBs and though she is fatigued afterward had O2 in the mid/high 90s the whole time with HR 60-70s   Stairs            Wheelchair Mobility    Modified Rankin (Stroke Patients Only)       Balance                                    Cognition Arousal/Alertness: Awake/alert Behavior During Therapy: WFL for tasks assessed/performed Overall Cognitive Status:  (pt with vague confusion)                      Exercises General Exercises - Lower Extremity Long Arc Quad: Strengthening;10 reps;Both;Seated Hip ABduction/ADduction: Strengthening;10  reps;Both;Seated Hip Flexion/Marching: Strengthening;10 reps;Seated;Both    General Comments        Pertinent Vitals/Pain Pain Assessment: No/denies pain (pt does report some soreness with resisted exercises)    Home Living Family/patient expects to be discharged to:: Skilled nursing facility Living Arrangements: Alone                  Prior Function Level of Independence: Needs assistance  Gait / Transfers Assistance Needed: 1 month ago, community ambulation; 1 week ago, household distances with RW, with intermittent episodic gait instability.        PT Goals (current goals can now be found in the care plan section) Progress  towards PT goals: Progressing toward goals    Frequency  Min 2X/week    PT Plan Current plan remains appropriate    Co-evaluation             End of Session Equipment Utilized During Treatment: Gait belt Activity Tolerance: Patient tolerated treatment well Patient left: in chair;with call bell/phone within reach;with family/visitor present     Time:  -     Charges:  $Gait Training: 8-22 mins $Therapeutic Exercise: 8-22 mins                    G Codes:     Wayne Both, PT, DPT [phone]  Kreg Shropshire 11/18/2015, 5:15 PM

## 2015-11-18 NOTE — Consult Note (Signed)
Reason for Consult: falls Referring Physician: Dr. Verdell Carmine  As per family mentation slightly improved. .  Past Medical History  Diagnosis Date  . GERD (gastroesophageal reflux disease)   . Hyperlipidemia   . Hypertension   . Cancer [facility])     colon, resection    History reviewed. No pertinent past surgical history.  Family History  Problem Relation Age of Onset  . Hypertension Other     Social History:  reports that she has never smoked. She does not have any smokeless tobacco history on file. She reports that she does not drink alcohol or use illicit drugs.  Allergies: No Known Allergies  Medications: personally reviewed by me  Results for orders placed or performed during the hospital encounter of 11/14/15 (from the past 48 hour(s))  Thyroid antibodies     Status: None   Collection Time: 11/16/15  1:00 PM  Result Value Ref Range   Thyroperoxidase Ab SerPl-aCnc <6 0 - 34 IU/mL   Thyroglobulin Antibody <1.0 0.0 - 0.9 IU/mL    Comment: (NOTE) Thyroglobulin Antibody measured by [facility] Methodology Performed At: [facility] [place], [place] [id number] Lindon Romp MD [id number]   Sedimentation rate     Status: None   Collection Time: 11/17/15  7:18 PM  Result Value Ref Range   Sed Rate 10 0 - 30 mm/hr    No results found.  Review of Systems  Constitutional: Negative.   HENT: Negative.   Eyes: Negative.   Respiratory: Negative.   Cardiovascular: Negative.   Gastrointestinal: Negative.   Genitourinary: Negative.   Musculoskeletal: Negative.   Skin: Negative.   Neurological: Positive for dizziness. Negative for tingling, tremors, sensory change, speech change, focal weakness, seizures and loss of consciousness.  Psychiatric/Behavioral: Negative.    Blood pressure 161/65, pulse 67, temperature 97.8 F (36.6 C), temperature source Oral, resp. rate 16, height 5' 5"  (1.651 m), weight 189 lb 3.2 oz (85.821 kg), SpO2 97  %. Physical Exam  Constitutional: She appears well-developed and well-nourished. No distress.  HENT:  Head: Normocephalic and atraumatic.  Right Ear: External ear normal.  Left Ear: External ear normal.  Nose: Nose normal.  Mouth/Throat: Oropharynx is clear and moist.  Eyes: Conjunctivae and EOM are normal. Pupils are equal, round, and reactive to light. No scleral icterus.  Neck: Normal range of motion. Neck supple.  Cardiovascular: Normal rate, regular rhythm, normal heart sounds and intact distal pulses.   No murmur heard. Respiratory: Effort normal and breath sounds normal. No respiratory distress.  GI: Soft. Bowel sounds are normal. She exhibits no distension.  Musculoskeletal: Normal range of motion. She exhibits no edema.  Neurological:  A+O to person only, nl speech and langauge, some difficulty with complex thoughts, MMSE 20/28 in which she missed distant recall and concentration PERRLA, EOMI, nl VF, face symmetric, tongue midline 5-/5 B, nl tone, no tremor FTN WNL 1+/4 B, mute plantars Pin and temp intact B  Skin: Skin is warm. She is not diaphoretic.  Psychiatric: Her mood appears anxious. Her speech is delayed.   MRI diffusion restriction that is subcortical. Pt has been having progressive decline in mental status and cognition. Based on examination this is likely neurodegenerative process.   - This could also be progressive Lewy body dementia or parkisnian plus syndrom like MSA, PSP( pt has difficulty looking up with is a cardinal sign, but can also be seen in elderly)  - Agree with LP which will be done by IR( case  d/w) need to order LP for 14:3:3 to look for CJD as this is a very strong diagnosis.   - Will obtain CTA Head to look for vasculitis.  - ESR, CRP ordered - Agree with EEG on Monday - do not think this is a demyelinating disorder at this point - will hold of steroids.    Along with 14:3:3 Tau and RTQuic will be processed which is only done at [facility] in [place] ([facility] diagnostic and process). Will attempt to call micro in AM.   The LP kit kit can't be re used after this pt due to strong suspicion of CJD   - mentation slightly improved - ESR, CRP ordered - pt is going for CTA - will follow up    [facility], Arrin Ishler 11/18/2015, 11:12 AM

## 2015-11-18 NOTE — Progress Notes (Signed)
[place] at [place] NAME: Misty Hernandez    MR#:  [id number]  DATE OF BIRTH:  [birth year]  SUBJECTIVE:   Patient here due to recurrent falls and dizziness. MRI Brain consistent w/ CJD and awaiting LP.  Family at bedside.   REVIEW OF SYSTEMS:    Review of Systems  Constitutional: Negative for fever and chills.  HENT: Negative for congestion and tinnitus.   Eyes: Negative for blurred vision and double vision.  Respiratory: Negative for cough, shortness of breath and wheezing.   Cardiovascular: Negative for chest pain, orthopnea and PND.  Gastrointestinal: Positive for constipation. Negative for nausea, vomiting, abdominal pain and diarrhea.  Genitourinary: Negative for dysuria and hematuria.  Musculoskeletal: Positive for falls.  Neurological: Negative for dizziness, sensory change and focal weakness.  All other systems reviewed and are negative.   Nutrition: Heart healthy Tolerating Diet: Yes Tolerating PT: Eval Noted.    DRUG ALLERGIES:  No Known Allergies  VITALS:  Blood pressure 161/65, pulse 67, temperature 97.8 F (36.6 C), temperature source Oral, resp. rate 16, height 5\' 5"  (1.651 m), weight 85.821 kg (189 lb 3.2 oz), SpO2 97 %.  PHYSICAL EXAMINATION:   Physical Exam  GENERAL:  81 y.o.-year-old patient lying in the bed with no acute distress.  EYES: Pupils equal, round, reactive to light and accommodation. No scleral icterus. Extraocular muscles intact.  HEENT: Head atraumatic, normocephalic. Oropharynx and nasopharynx clear.  NECK:  Supple, no jugular venous distention. No thyroid enlargement, no tenderness.  LUNGS: Normal breath sounds bilaterally, no wheezing, rales, rhonchi. No use of accessory muscles of respiration.  CARDIOVASCULAR: S1, S2 normal. No murmurs, rubs, or gallops.  ABDOMEN: Soft, nontender, nondistended. Bowel sounds present. No organomegaly or mass.  EXTREMITIES: No cyanosis, clubbing or edema  b/l.  Left arm in a cast.    NEUROLOGIC: Cranial nerves II through XII are intact. No focal Motor or sensory deficits b/l.   PSYCHIATRIC: The patient is alert and oriented x 3. Good affect.  SKIN: No obvious rash, lesion, or ulcer.    LABORATORY PANEL:   CBC  Recent Labs Lab 11/15/15 0243  WBC 7.2  HGB 13.5  HCT 39.9  PLT 223   ------------------------------------------------------------------------------------------------------------------  Chemistries   Recent Labs Lab 11/14/15 2103 11/15/15 0243  NA 137  --   K 3.7  --   CL 98*  --   CO2 29  --   GLUCOSE 114*  --   BUN 23*  --   CREATININE 1.04* 0.92  CALCIUM 9.2  --   AST 19  --   ALT 9*  --   ALKPHOS 89  --   BILITOT 0.6  --    ------------------------------------------------------------------------------------------------------------------  Cardiac Enzymes  Recent Labs Lab 11/14/15 2103  TROPONINI <0.03   ------------------------------------------------------------------------------------------------------------------  RADIOLOGY:  Ct Angio Head W/cm &/or Wo Cm  11/18/2015  CLINICAL DATA:  Vasculitis. Rapid cognitive decline. Multiple falls. EXAM: CT ANGIOGRAPHY HEAD TECHNIQUE: Multidetector CT imaging of the head was performed using the standard protocol during bolus administration of intravenous contrast. Multiplanar CT image reconstructions and MIPs were obtained to evaluate the vascular anatomy. CONTRAST:  77mL OMNIPAQUE IOHEXOL 350 MG/ML SOLN COMPARISON:  MRI head 11/15/2015 FINDINGS: CT HEAD Brain: Mild cortical atrophy. Mild chronic white matter changes. No acute infarct. Negative for hemorrhage or mass. Calvarium and skull base: Negative Paranasal sinuses: Mild mucosal edema in the paranasal sinuses. No air-fluid level. Orbits: Negative CTA HEAD Anterior circulation: Atherosclerotic calcification  in the cavernous carotid bilaterally with mild to moderate cavernous carotid stenosis bilaterally.  Anterior and middle cerebral arteries patent bilaterally without stenosis. Posterior circulation: Both vertebral arteries patent to the basilar. There is mild to moderate stenosis of the distal vertebral artery bilaterally due to calcified plaque. PICA patent bilaterally. Basilar widely patent. Superior cerebellar artery patent bilaterally. Moderately severe focal stenosis of the proximal left posterior cerebral artery. Severe disease in the right posterior cerebral artery which is occluded. Venous sinuses: Patent Anatomic variants: Negative for aneurysm Delayed phase:Normal enhancement on delayed imaging IMPRESSION: Atrophy and mild chronic microvascular ischemia. No acute intracranial abnormality Mild to moderate stenosis of the cavernous carotid bilaterally due to calcified stenosis Mild to moderate stenosis distal vertebral artery bilaterally. Moderate stenosis left posterior cerebral artery. Occluded right posterior cerebral artery. Electronically Signed   By: Franchot Gallo M.D.   On: 11/18/2015 12:02     ASSESSMENT AND PLAN:   81 year old female with past history of chronic atrial fibrillation, HTN, Hyperlipidemia, who presents to the hospital due to recurrent falls and dizziness.  #1 recurrent falls/dizziness-etiology unclear presently. Patient apparently 2 months ago was driving and living independently. Patient CT head on admission is negative. She had an MRI of the brain at [facility] about a month or so ago which was also negative. -Appreciate neurology input and MRI here showing possible CJD.  Had EEG which was not consistent w/ CJD.  - discussed w/ Neurology and she needs an LP and likely to be done on Monday/Tuesday.  - she will need spinal fluid tests for 14:3:3, Tau, TRquic and it needs to send to specific Lap in [place] ([facility]) - CTA Head showing no evidence of vasculitis as per Neurology and will monitor.   #2 chronic atrial fibrillation-rate controlled. Continue  metoprolol. - hold Eliquis as pt. Will need fluoro guided LP in next 2 days.   #3 GERD-continue Protonix.  #4 hypertension-continue metoprolol.  #5 Constipation - resolved now w/ MOM and dulcolax.   All the records are reviewed and case discussed with Care Management/Social Workerr. Management plans discussed with the patient, family and they are in agreement.  CODE STATUS: Full  DVT Prophylaxis: SCD's and TED's  TOTAL TIME TAKING CARE OF THIS PATIENT: 25 minutes.   POSSIBLE D/C IN 1-2 DAYS, DEPENDING ON CLINICAL CONDITION.   Henreitta Leber M.D on 11/18/2015 at 12:57 PM  Between 7am to 6pm - Pager - [phone]  After 6pm go to [email or web address] - password [email or web address] [facility]  [facility]  Office  [phone]  CC: Primary care physician; [place] Pink, MD

## 2015-11-19 MED ORDER — LORAZEPAM 2 MG/ML IJ SOLN
0.5000 mg | Freq: Once | INTRAMUSCULAR | Status: DC
  Filled 2015-11-19: qty 1

## 2015-11-19 NOTE — Progress Notes (Signed)
Pt has become very restless trying to get out of bed, daughter at bedside requesting pt be given medication to "calm her down" Dr Jannifer Franklin called and orders obtained for iv ativan 0.5 mg x 1 dose

## 2015-11-19 NOTE — Progress Notes (Signed)
Daughter does not want pt to have iv ativan at this time because pt just fell asleep ,

## 2015-11-19 NOTE — Progress Notes (Signed)
[place] at [place] NAME: Misty Hernandez    MR#:  [id number]  DATE OF BIRTH:  Jun 09, 1932  SUBJECTIVE:   Patient here due to recurrent falls and dizziness. MRI Brain consistent w/ CJD and awaiting LP.  Family at bedside. Walked with PT yesterday.   REVIEW OF SYSTEMS:    Review of Systems  Constitutional: Negative for fever and chills.  HENT: Negative for congestion and tinnitus.   Eyes: Negative for blurred vision and double vision.  Respiratory: Negative for cough, shortness of breath and wheezing.   Cardiovascular: Negative for chest pain, orthopnea and PND.  Gastrointestinal: Negative for nausea, vomiting, abdominal pain, diarrhea and constipation.  Genitourinary: Negative for dysuria and hematuria.  Musculoskeletal: Positive for falls.  Neurological: Negative for dizziness, sensory change and focal weakness.  All other systems reviewed and are negative.   Nutrition: Heart healthy Tolerating Diet: Yes Tolerating PT: Eval Noted.    DRUG ALLERGIES:  No Known Allergies  VITALS:  Blood pressure 145/85, pulse 82, temperature 98.3 F (36.8 C), temperature source Oral, resp. rate 18, height 5\' 5"  (1.651 m), weight 85.821 kg (189 lb 3.2 oz), SpO2 96 %.  PHYSICAL EXAMINATION:   Physical Exam  GENERAL:  81 y.o.-year-old patient lying in the bed with no acute distress.  EYES: Pupils equal, round, reactive to light and accommodation. No scleral icterus. Extraocular muscles intact.  HEENT: Head atraumatic, normocephalic. Oropharynx and nasopharynx clear.  NECK:  Supple, no jugular venous distention. No thyroid enlargement, no tenderness.  LUNGS: Normal breath sounds bilaterally, no wheezing, rales, rhonchi. No use of accessory muscles of respiration.  CARDIOVASCULAR: S1, S2 normal. No murmurs, rubs, or gallops.  ABDOMEN: Soft, nontender, nondistended. Bowel sounds present. No organomegaly or mass.  EXTREMITIES: No cyanosis,  clubbing or edema b/l.  Left arm in a cast.    NEUROLOGIC: Cranial nerves II through XII are intact. No focal Motor or sensory deficits b/l.   PSYCHIATRIC: The patient is alert and oriented x 3. Good affect.  SKIN: No obvious rash, lesion, or ulcer.    LABORATORY PANEL:   CBC  Recent Labs Lab 11/15/15 0243  WBC 7.2  HGB 13.5  HCT 39.9  PLT 223   ------------------------------------------------------------------------------------------------------------------  Chemistries   Recent Labs Lab 11/14/15 2103 11/15/15 0243  NA 137  --   K 3.7  --   CL 98*  --   CO2 29  --   GLUCOSE 114*  --   BUN 23*  --   CREATININE 1.04* 0.92  CALCIUM 9.2  --   AST 19  --   ALT 9*  --   ALKPHOS 89  --   BILITOT 0.6  --    ------------------------------------------------------------------------------------------------------------------  Cardiac Enzymes  Recent Labs Lab 11/14/15 2103  TROPONINI <0.03   ------------------------------------------------------------------------------------------------------------------  RADIOLOGY:  Ct Angio Head W/cm &/or Wo Cm  11/18/2015  CLINICAL DATA:  Vasculitis. Rapid cognitive decline. Multiple falls. EXAM: CT ANGIOGRAPHY HEAD TECHNIQUE: Multidetector CT imaging of the head was performed using the standard protocol during bolus administration of intravenous contrast. Multiplanar CT image reconstructions and MIPs were obtained to evaluate the vascular anatomy. CONTRAST:  56mL OMNIPAQUE IOHEXOL 350 MG/ML SOLN COMPARISON:  MRI head 11/15/2015 FINDINGS: CT HEAD Brain: Mild cortical atrophy. Mild chronic white matter changes. No acute infarct. Negative for hemorrhage or mass. Calvarium and skull base: Negative Paranasal sinuses: Mild mucosal edema in the paranasal sinuses. No air-fluid level. Orbits: Negative CTA HEAD Anterior circulation:  Atherosclerotic calcification in the cavernous carotid bilaterally with mild to moderate cavernous carotid stenosis  bilaterally. Anterior and middle cerebral arteries patent bilaterally without stenosis. Posterior circulation: Both vertebral arteries patent to the basilar. There is mild to moderate stenosis of the distal vertebral artery bilaterally due to calcified plaque. PICA patent bilaterally. Basilar widely patent. Superior cerebellar artery patent bilaterally. Moderately severe focal stenosis of the proximal left posterior cerebral artery. Severe disease in the right posterior cerebral artery which is occluded. Venous sinuses: Patent Anatomic variants: Negative for aneurysm Delayed phase:Normal enhancement on delayed imaging IMPRESSION: Atrophy and mild chronic microvascular ischemia. No acute intracranial abnormality Mild to moderate stenosis of the cavernous carotid bilaterally due to calcified stenosis Mild to moderate stenosis distal vertebral artery bilaterally. Moderate stenosis left posterior cerebral artery. Occluded right posterior cerebral artery. Electronically Signed   By: Franchot Gallo M.D.   On: 11/18/2015 12:02     ASSESSMENT AND PLAN:   81 year old female with past history of chronic atrial fibrillation, HTN, Hyperlipidemia, who presents to the hospital due to recurrent falls and dizziness.  #1 recurrent falls/dizziness-etiology unclear presently. Patient apparently 2 months ago was driving and living independently. Patient CT head on admission is negative. She had an MRI of the brain at [facility] in Nov'16 which was negative. -Appreciate neurology input and MRI here showing possible CJD.  Had EEG which was not consistent w/ CJD.  - discussed w/ Neurology and she needs an LP and likely to be done on Monday/Tuesday.  - she will need spinal fluid tests for 14:3:3, Tau, RTquic and it needs to be sent to a specific Lap in [place] ([facility]) - CTA Head showing no evidence of vasculitis.  Sed rate normal and CRP mildly elevated but non-specific.   #2 chronic atrial fibrillation-rate  controlled. Continue metoprolol. - hold Eliquis as pt. Will need fluoro guided LP in next 2 days.   #3 GERD-continue Protonix.  #4 hypertension-continue metoprolol.  #5 Constipation - resolved now w/ MOM and dulcolax.   All the records are reviewed and case discussed with Care Management/Social Workerr. Management plans discussed with the patient, family and they are in agreement.  CODE STATUS: Full  DVT Prophylaxis: SCD's and TED's  TOTAL TIME TAKING CARE OF THIS PATIENT: 25 minutes.   POSSIBLE D/C IN 1-2 DAYS, DEPENDING ON CLINICAL CONDITION.   Henreitta Leber M.D on 11/19/2015 at 12:58 PM  Between 7am to 6pm - Pager - [phone]  After 6pm go to [email or web address] - password [email or web address] [facility]  [facility]  Office  [phone]  CC: Primary care physician; [place] Pink, MD

## 2015-11-20 DIAGNOSIS — R296 Repeated falls: Secondary | ICD-10-CM | POA: Diagnosis not present

## 2015-11-20 DIAGNOSIS — R4182 Altered mental status, unspecified: Secondary | ICD-10-CM | POA: Diagnosis not present

## 2015-11-20 DIAGNOSIS — A8100 Creutzfeldt-Jakob disease, unspecified: Secondary | ICD-10-CM | POA: Diagnosis not present

## 2015-11-20 NOTE — Progress Notes (Signed)
Initial Nutrition Assessment   INTERVENTION:   Meals and Snacks: Cater to patient preferences Medical Food Supplement Therapy: will recommend on follow if intake poor   NUTRITION DIAGNOSIS:   No nutrition diagnosis at this time   GOAL:   Patient will meet greater than or equal to 90% of their needs  MONITOR:    (Energy Intake, Anthropometrics, Electrolyte and Renal PRofile)  REASON FOR ASSESSMENT:   LOS    ASSESSMENT:   Pt admitted with weakness and confusion. Per MD note MRI brain consistent with CJD, awaiting LP; Neuro following.  Past Medical History  Diagnosis Date  . GERD (gastroesophageal reflux disease)   . Hyperlipidemia   . Hypertension   . Cancer [facility])     colon, resection    Diet Order:  Diet Heart Room service appropriate?: Yes; Fluid consistency:: Thin    Current Nutrition: Pt reports good appetite this am. Pt ate 100% of breakfast tray.  Food/Nutrition-Related History: Pt reports appetite is doing ok. Pt family reports pt usually eats very well for lunch and dinner but sometimes less in the am as she has been droggy at times from nighttime meds. Recorded po intake 80% of meals on average since admission.   Scheduled Medications:  . LORazepam  0.5 mg Intravenous Once  . metoprolol tartrate  25 mg Oral BID  . multivitamin with minerals  1 tablet Oral Q supper  . omega-3 acid ethyl esters  1 g Oral Daily  . pantoprazole  40 mg Oral Q breakfast  . sodium chloride  1,000 mL Intravenous Once  . vitamin B-12  1,000 mcg Oral Daily     Electrolyte/Renal Profile and Glucose Profile:   Recent Labs Lab 11/14/15 2103 11/15/15 0243  NA 137  --   K 3.7  --   CL 98*  --   CO2 29  --   BUN 23*  --   CREATININE 1.04* 0.92  CALCIUM 9.2  --   GLUCOSE 114*  --    Protein Profile:  Recent Labs Lab 11/14/15 2103  ALBUMIN 4.4    Gastrointestinal Profile: Last BM: 11/18/2015   Weight Change: Pt weight stable since last encounter per  [facility]   Height:   Ht Readings from Last 1 Encounters:  11/14/15 5\' 5"  (1.651 m)    Weight:   Wt Readings from Last 1 Encounters:  11/15/15 189 lb 3.2 oz (85.821 kg)    Wt Readings from Last 10 Encounters:  11/15/15 189 lb 3.2 oz (85.821 kg)  10/14/15 190 lb (86.183 kg)     BMI:  Body mass index is 31.48 kg/(m^2).   EDUCATION NEEDS:   No education needs identified at this time   [place], RD, LDN Pager [phone] Weekend/On-Call Pager [phone]

## 2015-11-20 NOTE — Progress Notes (Addendum)
PT WITH CONTINUED NECK PAIN UNRELIEVED WITH ANALGESIA. MD-DR Verdell Carmine  ACKNOWLEDGED RN MAY USE HEATING PAD

## 2015-11-20 NOTE — Progress Notes (Signed)
[place] at [place] NAME: Misty Hernandez    MR#:  [id number]  DATE OF BIRTH:  09/01/1932  SUBJECTIVE:   Patient here due to recurrent falls and dizziness. MRI Brain consistent w/ CJD and awaiting LP.  Family at bedside.   REVIEW OF SYSTEMS:    Review of Systems  Constitutional: Negative for fever and chills.  HENT: Negative for congestion and tinnitus.   Eyes: Negative for blurred vision and double vision.  Respiratory: Negative for cough, shortness of breath and wheezing.   Cardiovascular: Negative for chest pain, orthopnea and PND.  Gastrointestinal: Negative for nausea, vomiting, abdominal pain, diarrhea and constipation.  Genitourinary: Negative for dysuria and hematuria.  Musculoskeletal: Positive for falls.  Neurological: Negative for dizziness, sensory change and focal weakness.  All other systems reviewed and are negative.   Nutrition: Heart healthy Tolerating Diet: Yes Tolerating PT: Eval Noted.    DRUG ALLERGIES:  No Known Allergies  VITALS:  Blood pressure 163/58, pulse 62, temperature 98.1 F (36.7 C), temperature source Oral, resp. rate 16, height 5\' 5"  (1.651 m), weight 85.821 kg (189 lb 3.2 oz), SpO2 95 %.  PHYSICAL EXAMINATION:   Physical Exam  GENERAL:  81 y.o.-year-old patient lying in the bed with no acute distress.  EYES: Pupils equal, round, reactive to light and accommodation. No scleral icterus. Extraocular muscles intact.  HEENT: Head atraumatic, normocephalic. Oropharynx and nasopharynx clear.  NECK:  Supple, no jugular venous distention. No thyroid enlargement, no tenderness.  LUNGS: Normal breath sounds bilaterally, no wheezing, rales, rhonchi. No use of accessory muscles of respiration.  CARDIOVASCULAR: S1, S2 normal. No murmurs, rubs, or gallops.  ABDOMEN: Soft, nontender, nondistended. Bowel sounds present. No organomegaly or mass.  EXTREMITIES: No cyanosis, clubbing or edema b/l.  Left arm  in a cast.    NEUROLOGIC: Cranial nerves II through XII are intact. No focal Motor or sensory deficits b/l.   PSYCHIATRIC: The patient is alert and oriented x 3. Good affect.  SKIN: No obvious rash, lesion, or ulcer.    LABORATORY PANEL:   CBC  Recent Labs Lab 11/15/15 0243  WBC 7.2  HGB 13.5  HCT 39.9  PLT 223   ------------------------------------------------------------------------------------------------------------------  Chemistries   Recent Labs Lab 11/14/15 2103 11/15/15 0243  NA 137  --   K 3.7  --   CL 98*  --   CO2 29  --   GLUCOSE 114*  --   BUN 23*  --   CREATININE 1.04* 0.92  CALCIUM 9.2  --   AST 19  --   ALT 9*  --   ALKPHOS 89  --   BILITOT 0.6  --    ------------------------------------------------------------------------------------------------------------------  Cardiac Enzymes  Recent Labs Lab 11/14/15 2103  TROPONINI <0.03   ------------------------------------------------------------------------------------------------------------------  RADIOLOGY:  No results found.   ASSESSMENT AND PLAN:   81 year old female with past history of chronic atrial fibrillation, HTN, Hyperlipidemia, who presents to the hospital due to recurrent falls and dizziness.  #1 recurrent falls/dizziness-etiology unclear presently. Patient 2 months ago was driving and living independently. Patient CT head on admission is negative. She had an MRI of the brain at [facility] in Nov'16 which was negative. -Appreciate neurology input and MRI here showing possible CJD.  Had EEG which was not consistent w/ CJD.  - discussed w/ Neurology and she needs an LP and likely to be done tomorrow.  - she will need spinal fluid tests for 14:3:3, Tau, RTquic and  it needs to be sent to a specific Lap in [place] ([facility]) - CTA Head showing no evidence of vasculitis.  Sed rate normal and CRP mildly elevated but non-specific.   #2 chronic atrial fibrillation-rate controlled.  Continue metoprolol. - hold Eliquis as pt. Will need fluoro guided LP in next 2 days.   #3 GERD-continue Protonix.  #4 hypertension-continue metoprolol.  #5 Constipation - resolved.   Likely discharge to SNF post LP tomorrow.   All the records are reviewed and case discussed with Care Management/Social Workerr. Management plans discussed with the patient, family and they are in agreement.  CODE STATUS: Full  DVT Prophylaxis: SCD's and TED's  TOTAL TIME TAKING CARE OF THIS PATIENT: 25 minutes.   POSSIBLE D/C IN 1-2 DAYS, DEPENDING ON CLINICAL CONDITION.   Henreitta Leber M.D on 11/20/2015 at 2:16 PM  Between 7am to 6pm - Pager - [phone]  After 6pm go to [email or web address] - password [email or web address] [facility]  [facility]  Office  [phone]  CC: Primary care physician; [place] Pink, MD

## 2015-11-20 NOTE — Progress Notes (Signed)
Patient continues to be confused today.Misty Hernandez  Unsteady when ambulating.  Continent of bladder.  Family has been with the pt all day

## 2015-11-20 NOTE — Progress Notes (Signed)
Pts. Been c/o severe neck pain throughout the night. PO meds have been administered with little relief.

## 2015-11-20 NOTE — Consult Note (Signed)
Reason for Consult: falls Referring Physician: Dr. Verdell Carmine  As per family mentation slightly improved. .  Past Medical History  Diagnosis Date  . GERD (gastroesophageal reflux disease)   . Hyperlipidemia   . Hypertension   . Cancer [facility])     colon, resection    History reviewed. No pertinent past surgical history.  Family History  Problem Relation Age of Onset  . Hypertension Other     Social History:  reports that she has never smoked. She does not have any smokeless tobacco history on file. She reports that she does not drink alcohol or use illicit drugs.  Allergies: No Known Allergies  Medications: personally reviewed by me  No results found for this or any previous visit (from the past 48 hour(s)).  No results found.  Review of Systems  Constitutional: Negative.   HENT: Negative.   Eyes: Negative.   Respiratory: Negative.   Cardiovascular: Negative.   Gastrointestinal: Negative.   Genitourinary: Negative.   Musculoskeletal: Negative.   Skin: Negative.   Neurological: Positive for dizziness. Negative for tingling, tremors, sensory change, speech change, focal weakness, seizures and loss of consciousness.  Psychiatric/Behavioral: Negative.    Blood pressure 163/58, pulse 62, temperature 98.1 F (36.7 C), temperature source Oral, resp. rate 16, height 5' 5"  (1.651 m), weight 189 lb 3.2 oz (85.821 kg), SpO2 95 %. Physical Exam  Constitutional: She appears well-developed and well-nourished. No distress.  HENT:  Head: Normocephalic and atraumatic.  Right Ear: External ear normal.  Left Ear: External ear normal.  Nose: Nose normal.  Mouth/Throat: Oropharynx is clear and moist.  Eyes: Conjunctivae and EOM are normal. Pupils are equal, round, and reactive to light. No scleral icterus.  Neck: Normal range of motion. Neck supple.  Cardiovascular: Normal rate, regular rhythm, normal heart sounds and intact distal pulses.   No murmur heard. Respiratory: Effort normal  and breath sounds normal. No respiratory distress.  GI: Soft. Bowel sounds are normal. She exhibits no distension.  Musculoskeletal: Normal range of motion. She exhibits no edema.  Neurological:  A+O to person only, nl speech and langauge, some difficulty with complex thoughts, MMSE 20/28 in which she missed distant recall and concentration PERRLA, EOMI, nl VF, face symmetric, tongue midline 5-/5 B, nl tone, no tremor FTN WNL 1+/4 B, mute plantars Pin and temp intact B  Skin: Skin is warm. She is not diaphoretic.  Psychiatric: Her mood appears anxious. Her speech is delayed.   MRI diffusion restriction that is subcortical. Pt has been having progressive decline in mental status and cognition. Based on examination this is likely neurodegenerative process.   - This could also be progressive Lewy body dementia or parkisnian plus syndrom like MSA, PSP( pt has difficulty looking up with is a cardinal sign, but can also be seen in elderly)    CTA does not explain the current condition Family did check for carbon monoxide leaks which were negative    LP will be done tomorrow and please send the following to: After LP pt can be d/c Along with 14:3:3 Tau and RTQuic will be processed which is only done at [facility] in [place] ([facility] and [facility]). Will attempt to call micro in AM.   The LP kit kit can't be re used after this pt due to strong suspicion of CJD    Zyair Rhein 11/20/2015, 12:08 PM

## 2015-11-20 NOTE — Clinical Social Work Note (Signed)
Clinical Social Worker met with pt and daughter to address discharge plan. Pt has a private room at [facility] Facility is able to accept pt at discharge, which will likely be tomorrow. MD is aware. CSW will continue to follow.   Darden Dates, MSW, LCSW Clinical Social Worker  [phone]

## 2015-11-20 NOTE — Progress Notes (Signed)
Physical Therapy Treatment Patient Details Name: Misty Hernandez MRN: [id number] DOB: 11-25-1932 Today's Date: 11/20/2015    History of Present Illness Pt is a 81yo white female who came to [facility] after progressively worsening dizzy spells and gait instability that started around mid November. Pt experienced a fall around Thanksgiving that resulted in a L wrist fracture (still in cast at eval). Pt has been receiving in home PT and OT since, but does not  report any significant progress in her mobility and CC of diziness. She has had 24/7 supervision from family since that episode. Family is unable to report any clear medication changes for the last 3 months. PMH includes Afib. Pt denies spinning sensations, loss of vision, or changes to hearing with episodes, and reports that usually take ~30 minutes to resolve once she sits. She denies knee buckling or LOC, reports that she just feels unsteady.     PT Comments    Patient demonstrates significantly more limitations than previous PT session 2 days ago. Patient ambulates 19' with seated rest break at 72' with moderate assistance from PT to steady person and RW, demonstrating posterior R lateral lean. Concern for Parkinsonian-like symptoms present with mask-like flat affect, steppage gait, and light disturbances during ambulation. Patient responded to cue to take "bigger steps" with improved gait sequencing. Did experience LOB with no fall. Patient will continue to benefit from progressive PT to allow for improved mobility and stability as tolerated.  Follow Up Recommendations  SNF     Equipment Recommendations  None recommended by PT    Recommendations for Other Services       Precautions / Restrictions Precautions Precautions: Fall Restrictions Weight Bearing Restrictions: No    Mobility  Bed Mobility Overal bed mobility: Needs Assistance Bed Mobility: Supine to Sit     Supine to sit: Min assist     General bed mobility comments:  Patient requires minimal assistance to scoot to EOB.   Transfers Overall transfer level: Needs assistance Equipment used: Rolling walker (2 wheeled) Transfers: Sit to/from Stand Sit to Stand: Min assist         General transfer comment: Patient requires verbal cues for hand placement. Notable R posterior lean upon first attempt.   Ambulation/Gait Ambulation/Gait assistance: Mod assist Ambulation Distance (Feet): 30 Feet Assistive device: Rolling walker (2 wheeled) Gait Pattern/deviations: Shuffle;Wide base of support;Staggering right;Leaning posteriorly     General Gait Details: Patient demonstrates steppage gait, showing decreased ability to weightshift, having tendency to lean to R posterior. Patient required seated rest break after 15'. Minor LOB with no falls.   Stairs            Wheelchair Mobility    Modified Rankin (Stroke Patients Only)       Balance Overall balance assessment: Needs assistance;History of Falls Sitting-balance support: Feet supported Sitting balance-Leahy Scale: Fair Sitting balance - Comments: Occasional posterior lean without back support Postural control: Posterior lean Standing balance support: Bilateral upper extremity supported Standing balance-Leahy Scale: Poor Standing balance comment: Patient unable to maintain balance without moderate assistnce                    Cognition Arousal/Alertness: Awake/alert Behavior During Therapy: Flat affect Overall Cognitive Status: Within Functional Limits for tasks assessed                      Exercises      General Comments        Pertinent Vitals/Pain Pain Assessment:  No/denies pain    Home Living                      Prior Function            PT Goals (current goals can now be found in the care plan section) Acute Rehab PT Goals PT Goal Formulation: With patient/family Time For Goal Achievement: 11/29/15 Potential to Achieve Goals: Fair Progress  towards PT goals: PT to reassess next treatment    Frequency  Min 2X/week    PT Plan Current plan remains appropriate    Co-evaluation             End of Session Equipment Utilized During Treatment: Gait belt Activity Tolerance: Patient limited by fatigue;Other (comment) (Dizziness) Patient left: in chair;with call bell/phone within reach;with chair alarm set;with family/visitor present     Time: [id number] PT Time Calculation (min) (ACUTE ONLY): 33 min  Charges:  $Gait Training: 23-37 mins                    G Codes:  Functional Assessment Tool Used: Clinical Judgment  Functional Limitation: Mobility: Walking and moving around Mobility: Walking and Moving Around Current Status [id number]): At least 80 percent but less than 100 percent impaired, limited or restricted Mobility: Walking and Moving Around Goal Status [phone]): At least 80 percent but less than 100 percent impaired, limited or restricted   Dorice Lamas, PT, DPT 11/20/2015, 3:59 PM

## 2015-11-21 ENCOUNTER — Observation Stay: Payer: Medicare Other

## 2015-11-21 DIAGNOSIS — A8100 Creutzfeldt-Jakob disease, unspecified: Secondary | ICD-10-CM | POA: Diagnosis not present

## 2015-11-21 DIAGNOSIS — R4182 Altered mental status, unspecified: Secondary | ICD-10-CM | POA: Diagnosis not present

## 2015-11-21 LAB — PROTIME-INR
INR: 1.08
Prothrombin Time: 14.2 s (ref 11.4–15.0)

## 2015-11-21 LAB — APTT: aPTT: 30 s (ref 24–36)

## 2015-11-21 NOTE — Clinical Social Work Placement (Signed)
   CLINICAL SOCIAL WORK PLACEMENT  NOTE  Date:  11/21/2015  Patient Details  Name: Misty Hernandez MRN: [id number] Date of Birth: 1932/02/06  Clinical Social Work is seeking post-discharge placement for this patient at the [place] level of care (*CSW will initial, date and re-position this form in  chart as items are completed):  Yes   Patient/family provided with [facility] Work Department's list of facilities offering this level of care within the geographic area requested by the patient (or if unable, by the patient's family).  Yes   Patient/family informed of their freedom to choose among providers that offer the needed level of care, that participate in Medicare, Medicaid or managed care program needed by the patient, have an available bed and are willing to accept the patient.  Yes   Patient/family informed of [facility]'s ownership interest in [facility] and [facility], as well as of the fact that they are under no obligation to receive care at these facilities.  PASRR submitted to EDS on       PASRR number received on       Existing PASRR number confirmed on 11/16/15     FL2 transmitted to all facilities in geographic area requested by pt/family on 11/16/15     FL2 transmitted to all facilities within larger geographic area on       Patient informed that his/her managed care company has contracts with or will negotiate with certain facilities, including the following:        Yes   Patient/family informed of bed offers received.  Patient chooses bed at  [facility])     Physician recommends and patient chooses bed at  [facility])    Patient to be transferred to [facility] on 11/21/15.  Patient to be transferred to facility by [facility]     Patient family notified on 11/21/15 of transfer.  Name of family member notified:  [name], daughter     PHYSICIAN       Additional Comment:     _______________________________________________ Darden Dates, LCSW 11/21/2015, 2:55 PM

## 2015-11-21 NOTE — Discharge Summary (Signed)
[facility] NAME: Misty Hernandez    MR#:  [id number]  DATE OF BIRTH:  03-Aug-1932  DATE OF ADMISSION:  11/14/2015 ADMITTING PHYSICIAN: Lytle Butte, MD  DATE OF DISCHARGE: 11/21/2015 PRIMARY CARE PHYSICIAN: Maryland Pink, MD    ADMISSION DIAGNOSIS:  Altered mental status, unspecified altered mental status type [R41.82]  DISCHARGE DIAGNOSIS:  Active Problems:   Frequent falls   Altered mental status   Falls frequently   SECONDARY DIAGNOSIS:   Past Medical History  Diagnosis Date  . GERD (gastroesophageal reflux disease)   . Hyperlipidemia   . Hypertension   . Cancer [facility])     colon, resection    HOSPITAL COURSE:   81 year old female with past history of chronic atrial fibrillation, HTN, Hyperlipidemia, who presents to the hospital due to recurrent falls and dizziness.  1. Recurrent falls/dizziness: Family reports that 2 months ago patient was driving and living independently. She has had a clear decompensation over the past 2 months. 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 She underwent EEG which was not consistent with this diagnosis. Consulted regarding her neurological status. They recommended lumbar puncture with spinal fluid test for 14:3:3, Tau, RTquic and it needs to be sent to a specific Lap in [place] ([facility]). I have personally spoken with radiology and these tests will be ordered. CTA Head shows no evidence of vasculitis. ESR is normal and CRP mildly elevated but non-specific. She will have follow-up with neurology regarding these lab results.  2. Chronic atrial fibrillation: Patient is on metoprolol and eliquis. ELIQUIS  was held temporarily for lumbar puncture. This will be restarted at discharge.  3. GERD: Continue PPI  4. Essential hypertension: Continue metoprolol   DISCHARGE CONDITIONS AND DIET:  Patient is in stable condition for  discharge to skilled motion facility.  CONSULTS OBTAINED:  Treatment Team:  Lytle Butte, MD Corey Skains, MD Valora Corporal, MD Isaias Cowman, MD Leotis Pain, MD Bettey Costa, MD  DRUG ALLERGIES:  No Known Allergies  DISCHARGE MEDICATIONS:   Current Discharge Medication List    CONTINUE these medications which have NOT CHANGED   Details  apixaban (ELIQUIS) 5 MG TABS tablet Take 1 tablet by mouth 2 (two) times daily.    ibuprofen (ADVIL,MOTRIN) 400 MG tablet Take 2 tablets (800 mg total) by mouth every 8 (eight) hours as needed. Qty: 30 tablet, Refills: 0    metoprolol tartrate (LOPRESSOR) 25 MG tablet Take 25 mg by mouth 2 (two) times daily.    Multiple Vitamin (MULTIVITAMIN WITH MINERALS) TABS tablet Take 1 tablet by mouth daily.    omega-3 acid ethyl esters (LOVAZA) 1 G capsule Take 1 g by mouth daily.    omeprazole (PRILOSEC) 20 MG capsule Take 1 capsule by mouth daily.    triamterene-hydrochlorothiazide (MAXZIDE-25) 37.5-25 MG tablet Take 0.5 tablets by mouth daily.    vitamin B-12 (CYANOCOBALAMIN) 1000 MCG tablet Take 1,000 mcg by mouth daily.              Today   CHIEF COMPLAINT:  No acute issues overnight. Patient is planned for LP today.   VITAL SIGNS:  Blood pressure 169/76, pulse 64, temperature 98.3 F (36.8 C), temperature source Oral, resp. rate 20, height 5' 5"  (1.651 m), weight 85.821 kg (189 lb 3.2 oz), SpO2 95 %.   REVIEW OF SYSTEMS:  Review of Systems  Constitutional: Negative for fever, chills and malaise/fatigue.  HENT: Negative for  sore throat.   Eyes: Negative for blurred vision.  Respiratory: Negative for cough, hemoptysis, shortness of breath and wheezing.   Cardiovascular: Negative for chest pain, palpitations and leg swelling.  Gastrointestinal: Negative for nausea, vomiting, abdominal pain, diarrhea and blood in stool.  Genitourinary: Negative for dysuria.  Musculoskeletal: Negative for back pain.   Neurological: Negative for dizziness, tremors and headaches.  Endo/Heme/Allergies: Does not bruise/bleed easily.     PHYSICAL EXAMINATION:  GENERAL:  81 y.o.-year-old patient lying in the bed with no acute distress.  NECK:  Supple, no jugular venous distention. No thyroid enlargement, no tenderness.  LUNGS: Normal breath sounds bilaterally, no wheezing, rales,rhonchi  No use of accessory muscles of respiration.  CARDIOVASCULAR: S1, S2 normal. No murmurs, rubs, or gallops.  ABDOMEN: Soft, non-tender, non-distended. Bowel sounds present. No organomegaly or mass.  EXTREMITIES: No pedal edema, cyanosis, or clubbing. No obvious focal deficits or sensory deficits PSYCHIATRIC: The patient is alert and oriented x 3.  SKIN: No obvious rash, lesion, or ulcer.   DATA REVIEW:   CBC  Recent Labs Lab 11/15/15 0243  WBC 7.2  HGB 13.5  HCT 39.9  PLT 223    Chemistries   Recent Labs Lab 11/14/15 2103 11/15/15 0243  NA 137  --   K 3.7  --   CL 98*  --   CO2 29  --   GLUCOSE 114*  --   BUN 23*  --   CREATININE 1.04* 0.92  CALCIUM 9.2  --   AST 19  --   ALT 9*  --   ALKPHOS 89  --   BILITOT 0.6  --     Cardiac Enzymes  Recent Labs Lab 11/14/15 2103  TROPONINI <0.03    Microbiology Results  @[email or web address] @  RADIOLOGY:  No results found.    Management plans discussed with the patient and she is in agreement. Stable for discharge skilled nursing facility after LP  Patient should follow up with PCP and neurology  CODE STATUS:     Code Status Orders        Start     Ordered   11/15/15 0008  Full code   Continuous     11/15/15 0008      TOTAL TIME TAKING CARE OF THIS PATIENT: 35 minutes.    Note: This dictation was prepared with Dragon dictation along with smaller phrase technology. Any transcriptional errors that result from this process are unintentional.  Jaylyn Iyer M.D on 11/21/2015 at 9:14 AM  Between 7am to 6pm - Pager - [phone] After 6pm go  to [email or web address] - password [email or web address] [facility]  [facility]  Office  [phone]  CC: Primary care physician; [place] Pink, MD

## 2015-11-21 NOTE — Clinical Social Work Note (Signed)
Pt is ready for discharge today to [facility]. Facility has received discharge information and is ready to admit pt. Pt and family are agreeable to discharge plan. RN to call report and EMS will provide transportation. CSW is signing off as no further needs identified.   Darden Dates, MSW, LCSW Clinical Social Worker  [phone]

## 2015-11-21 NOTE — Progress Notes (Signed)
Discharged to [facility] via EMS. IV d/c

## 2015-11-21 NOTE — Consult Note (Signed)
As per family mentation slightly improved. S/p LP today  Past Medical History  Diagnosis Date  . GERD (gastroesophageal reflux disease)   . Hyperlipidemia   . Hypertension   . Cancer [facility])     colon, resection    History reviewed. No pertinent past surgical history.  Family History  Problem Relation Age of Onset  . Hypertension Other     Social History:  reports that she has never smoked. She does not have any smokeless tobacco history on file. She reports that she does not drink alcohol or use illicit drugs.  Allergies: No Known Allergies  Medications: personally reviewed by me  Results for orders placed or performed during the hospital encounter of 11/14/15 (from the past 48 hour(s))  Protime-INR     Status: None   Collection Time: 11/21/15  7:49 AM  Result Value Ref Range   Prothrombin Time 14.2 11.4 - 15.0 seconds   INR 1.08   APTT     Status: None   Collection Time: 11/21/15  7:49 AM  Result Value Ref Range   aPTT 30 24 - 36 seconds    Dg Fluoro Guide Lumbar Puncture  11/21/2015  CLINICAL DATA:  Altered mental status. EXAM: DIAGNOSTIC LUMBAR PUNCTURE UNDER FLUOROSCOPIC GUIDANCE FLUOROSCOPY TIME:  Fluoroscopy Time (in minutes and seconds): 0 minutes 30 seconds PROCEDURE: Informed consent was obtained from the patient's daughter prior to the procedure, including potential complications of headache, allergy, and pain. With the patient prone, the lower back was prepped with Betadine. 1% Lidocaine was used for local anesthesia. Lumbar puncture was performed at the L3-4 level using a 20 gauge needle with return of bloody CSF due to a traumatic tap. I entered an anterior epidural vein during needle insertion. Approximately 22 ml of blood tinged CSF were obtained for laboratory studies. The patient tolerated the procedure well and there were no apparent complications. IMPRESSION: Lumbar puncture performed at L3-4 with return of blood-tinged spinal fluid in due to a traumatic  tap. This was not due to subarachnoid hemorrhage. Mrs. Hipwell tolerated the procedure well. Electronically Signed   By: Lorriane Shire M.D.   On: 11/21/2015 11:23    Review of Systems  Constitutional: Negative.   HENT: Negative.   Eyes: Negative.   Respiratory: Negative.   Cardiovascular: Negative.   Gastrointestinal: Negative.   Genitourinary: Negative.   Musculoskeletal: Negative.   Skin: Negative.   Neurological: Positive for dizziness. Negative for tingling, tremors, sensory change, speech change, focal weakness, seizures and loss of consciousness.  Psychiatric/Behavioral: Negative.    Blood pressure 157/51, pulse 61, temperature 98.4 F (36.9 C), temperature source Oral, resp. rate 16, height 5\' 5"  (1.651 m), weight 189 lb 3.2 oz (85.821 kg), SpO2 97 %. Physical Exam  Constitutional: She appears well-developed and well-nourished. No distress.  HENT:  Head: Normocephalic and atraumatic.  Right Ear: External ear normal.  Left Ear: External ear normal.  Nose: Nose normal.  Mouth/Throat: Oropharynx is clear and moist.  Eyes: Conjunctivae and EOM are normal. Pupils are equal, round, and reactive to light. No scleral icterus.  Neck: Normal range of motion. Neck supple.  Cardiovascular: Normal rate, regular rhythm, normal heart sounds and intact distal pulses.   No murmur heard. Respiratory: Effort normal and breath sounds normal. No respiratory distress.  GI: Soft. Bowel sounds are normal. She exhibits no distension.  Musculoskeletal: Normal range of motion. She exhibits no edema.  Neurological:  A+O to person only, nl speech and langauge, some difficulty with  complex thoughts, MMSE 20/28 in which she missed distant recall and concentration PERRLA, EOMI, nl VF, face symmetric, tongue midline 5-/5 B, nl tone, no tremor FTN WNL 1+/4 B, mute plantars Pin and temp intact B  Skin: Skin is warm. She is not diaphoretic.  Psychiatric: Her mood appears anxious. Her speech is delayed.      LP done the appropriate labs of TAU, 14:3:3 AND RTQuic ordered and should be sent [facility] in [place] via [facility] for CJD testing.   D/c planning today to [facility].    D/w family      Leotis Pain 11/21/2015, 2:05 PM

## 2015-11-27 ENCOUNTER — Encounter
Admission: RE | Admit: 2015-11-27 | Discharge: 2015-11-27 | Disposition: A | Discharge status: Home / Self Care | Payer: Medicare Other | Source: Ambulatory Visit | Attending: Internal Medicine | Admitting: Internal Medicine

## 2015-11-27 DIAGNOSIS — I951 Orthostatic hypotension: Secondary | ICD-10-CM | POA: Insufficient documentation

## 2015-11-27 DIAGNOSIS — R300 Dysuria: Secondary | ICD-10-CM | POA: Insufficient documentation

## 2015-11-29 DIAGNOSIS — I951 Orthostatic hypotension: Secondary | ICD-10-CM | POA: Diagnosis not present

## 2015-11-29 DIAGNOSIS — R300 Dysuria: Secondary | ICD-10-CM | POA: Diagnosis not present

## 2015-11-29 LAB — URINALYSIS COMPLETE WITH MICROSCOPIC (ARMC ONLY)
Bilirubin Urine: NEGATIVE
Glucose, UA: NEGATIVE mg/dL
Hgb urine dipstick: NEGATIVE
Ketones, ur: NEGATIVE mg/dL
Leukocytes, UA: NEGATIVE
Nitrite: NEGATIVE
Protein, ur: NEGATIVE mg/dL
RBC / HPF: NONE SEEN RBC/hpf (ref 0–5)
Specific Gravity, Urine: 1.02 (ref 1.005–1.030)
Squamous Epithelial / LPF: NONE SEEN
pH: 5 (ref 5.0–8.0)

## 2015-12-02 LAB — URINE CULTURE: Culture: >=100000

## 2015-12-05 ENCOUNTER — Encounter: Payer: Self-pay | Admitting: Internal Medicine

## 2015-12-07 DIAGNOSIS — I951 Orthostatic hypotension: Secondary | ICD-10-CM | POA: Diagnosis not present

## 2015-12-07 LAB — COMPREHENSIVE METABOLIC PANEL
ALBUMIN: 3.8 g/dL (ref 3.5–5.0)
ALK PHOS: 66 U/L (ref 38–126)
ALT: 10 U/L — AB (ref 14–54)
AST: 21 U/L (ref 15–41)
Anion gap: 9 (ref 5–15)
BILIRUBIN TOTAL: 0.7 mg/dL (ref 0.3–1.2)
BUN: 24 mg/dL — AB (ref 6–20)
CALCIUM: 9.1 mg/dL (ref 8.9–10.3)
CO2: 28 mmol/L (ref 22–32)
Chloride: 99 mmol/L — ABNORMAL LOW (ref 101–111)
Creatinine, Ser: 1.36 mg/dL — ABNORMAL HIGH (ref 0.44–1.00)
GFR calc Af Amer: 41 mL/min — ABNORMAL LOW (ref >60)
GFR calc non Af Amer: 35 mL/min — ABNORMAL LOW (ref >60)
GLUCOSE: 99 mg/dL (ref 65–99)
Potassium: 3.5 mmol/L (ref 3.5–5.1)
Sodium: 136 mmol/L (ref 135–145)
TOTAL PROTEIN: 6.6 g/dL (ref 6.5–8.1)

## 2015-12-07 LAB — CBC WITH DIFFERENTIAL/PLATELET
Basophils Absolute: 0 10*3/uL (ref 0–0.1)
Basophils Relative: 0 %
EOS ABS: 0.3 10*3/uL (ref 0–0.7)
Eosinophils Relative: 4 %
HEMATOCRIT: 40.1 % (ref 35.0–47.0)
HEMOGLOBIN: 13.5 g/dL (ref 12.0–16.0)
LYMPHS ABS: 1.4 10*3/uL (ref 1.0–3.6)
Lymphocytes Relative: 19 %
MCH: 29.8 pg (ref 26.0–34.0)
MCHC: 33.5 g/dL (ref 32.0–36.0)
MCV: 88.9 fL (ref 80.0–100.0)
MONO ABS: 0.5 10*3/uL (ref 0.2–0.9)
MONOS PCT: 8 %
NEUTROS ABS: 4.8 10*3/uL (ref 1.4–6.5)
NEUTROS PCT: 69 %
Platelets: 264 10*3/uL (ref 150–440)
RBC: 4.51 MIL/uL (ref 3.80–5.20)
RDW: 14.1 % (ref 11.5–14.5)
WBC: 7 10*3/uL (ref 3.6–11.0)

## 2015-12-07 LAB — MISCELLANEOUS TEST

## 2016-01-24 DEATH — deceased

## 2017-07-02 IMAGING — CT CT ANGIO HEAD
3 of 9 series · 16 of 47 positions shown · IV contrast (APPLIED)
Comparison: MRI head 11/15/2015

CLINICAL DATA: Vasculitis. Rapid cognitive decline. Multiple falls.

EXAM:
CT ANGIOGRAPHY HEAD
TECHNIQUE: Multidetector CT imaging of the head was performed using the
standard protocol during bolus administration of intravenous
contrast. Multiplanar CT image reconstructions and MIPs were
obtained to evaluate the vascular anatomy.
CONTRAST:  75mL OMNIPAQUE IOHEXOL 350 MG/ML SOLN

[Series 6: cta head · axial · 0.39mm/px · z∈[+92,+218]mm · 10 of 156 slices shown]
[im 15/156  brain]
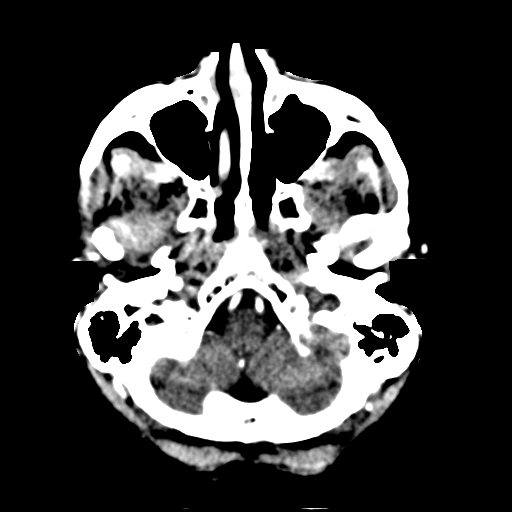
[im 29/156  bone]
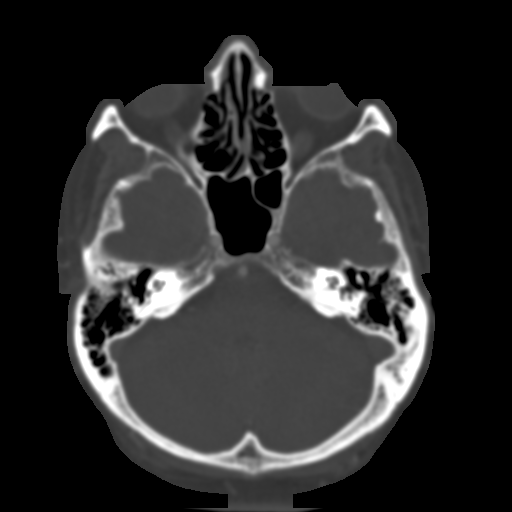
[im 43/156  brain]
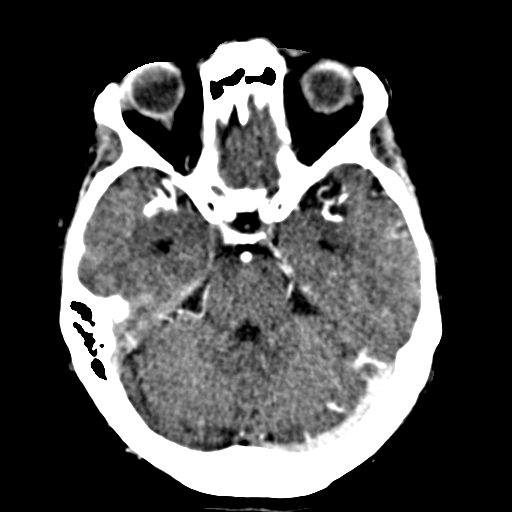
[im 57/156  bone]
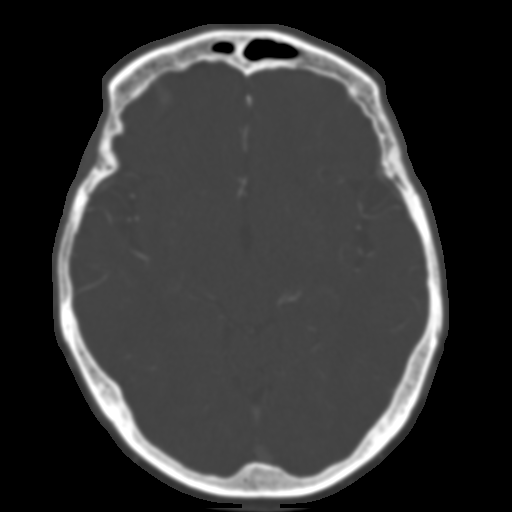
[im 71/156  brain]
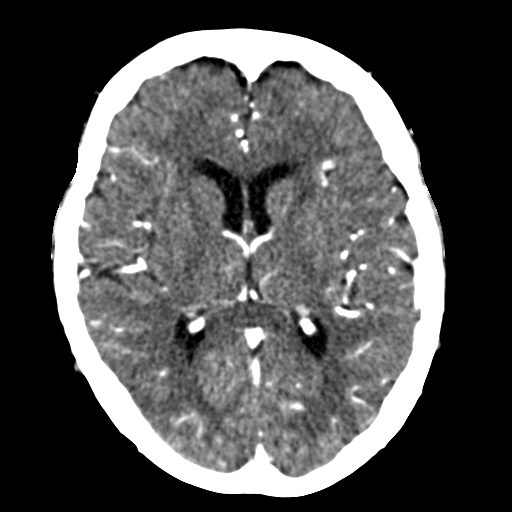
[im 85/156  bone]
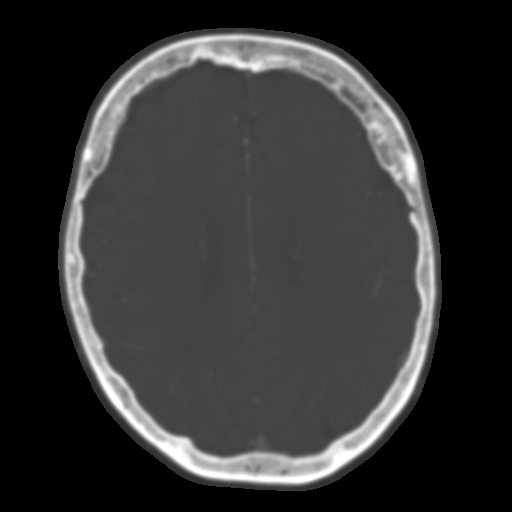
[im 99/156  brain]
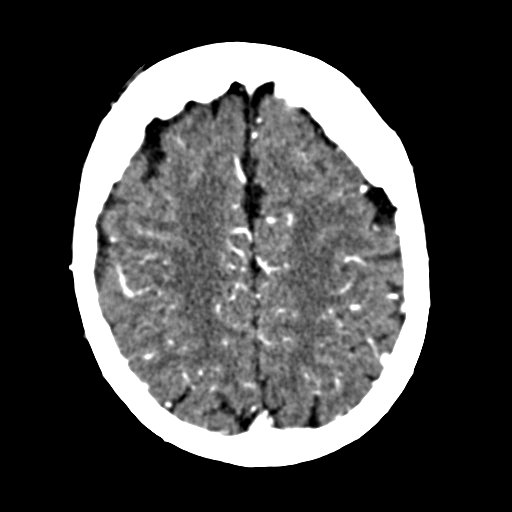
[im 113/156  bone]
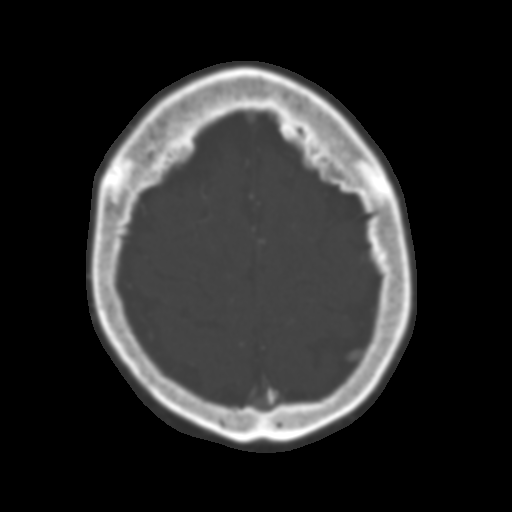
[im 127/156  brain]
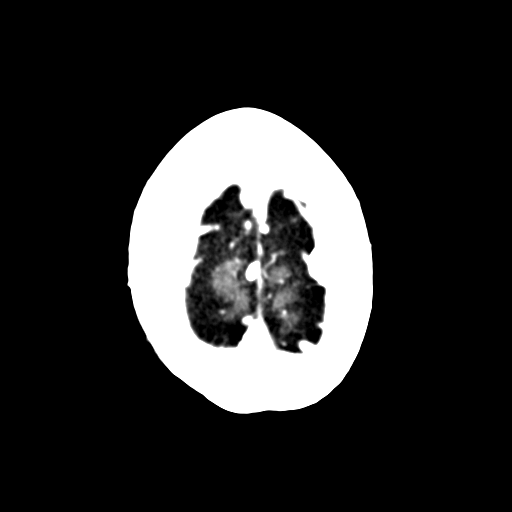
[im 141/156  bone]
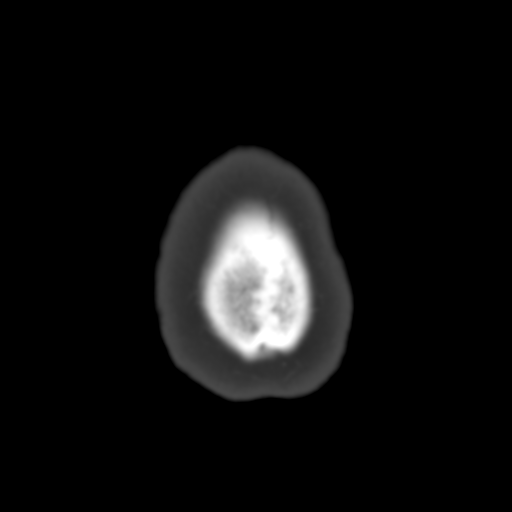

[Series 10: cor thin · coronal · 0.33mm/px · 3 of 202 slices shown]
[im 58/202  brain]
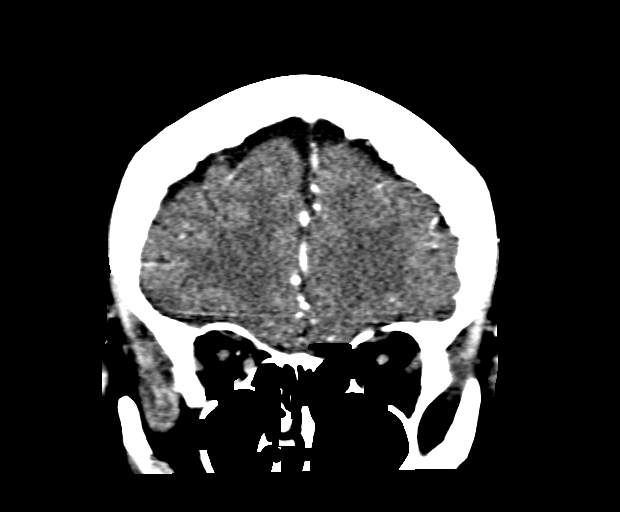
[im 87/202  brain]
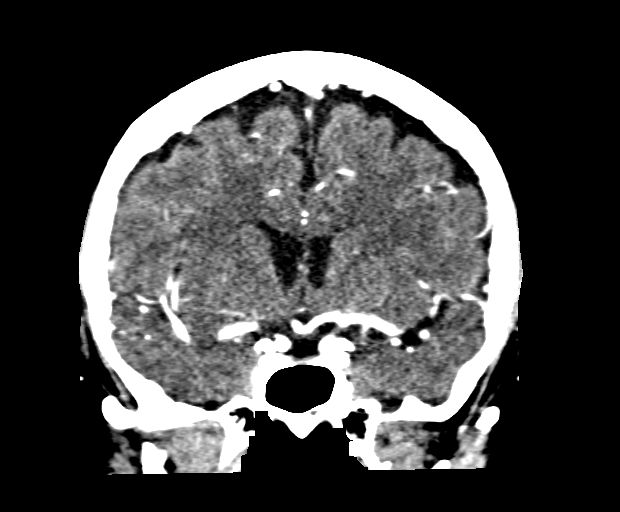
[im 115/202  brain]
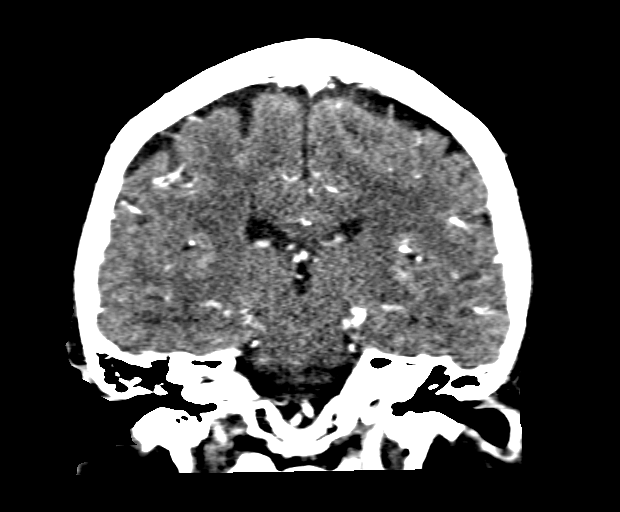

[Series 12: sag thin · sagittal · 0.31mm/px · 3 of 182 slices shown]
[im 37/182  brain]
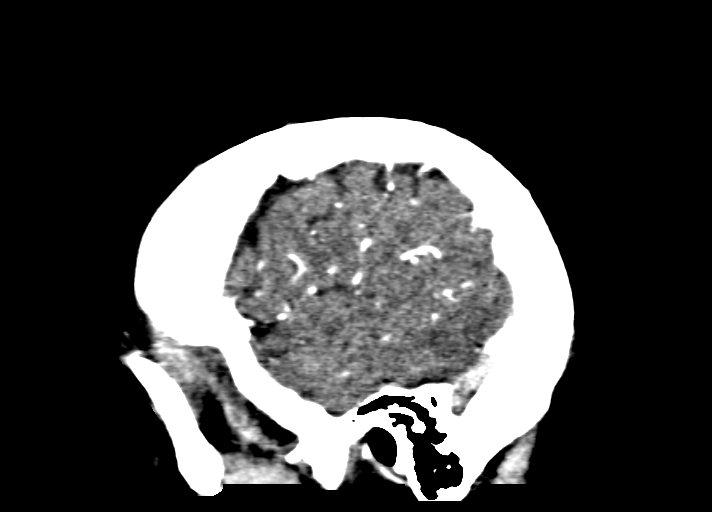
[im 73/182  brain]
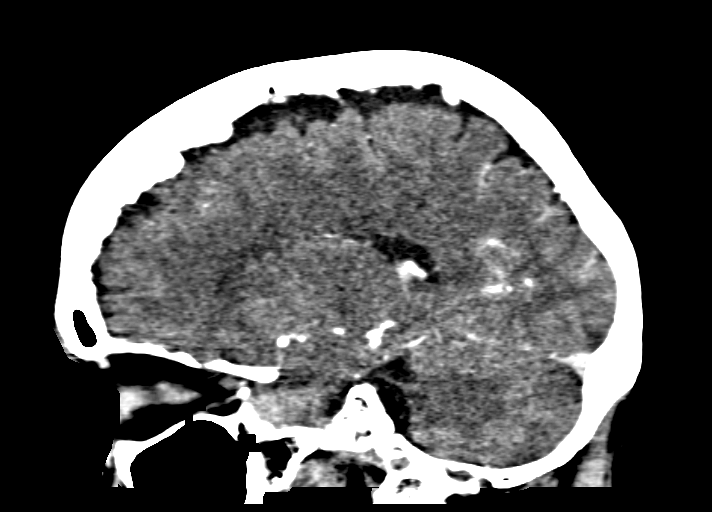
[im 109/182  brain]
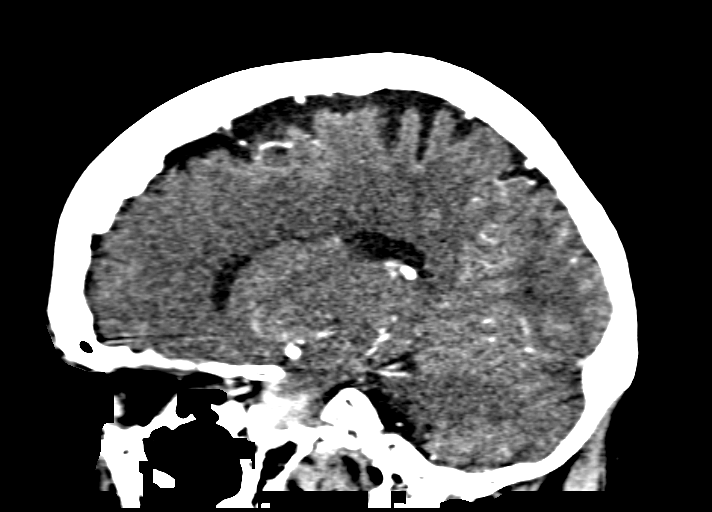

[16 of 47 positions shown; findings below may reference images not displayed]

FINDINGS: CT HEAD

Brain: Mild cortical atrophy. Mild chronic white matter changes. No
acute infarct. Negative for hemorrhage or mass.

Calvarium and skull base: Negative

Paranasal sinuses: Mild mucosal edema in the paranasal sinuses. No
air-fluid level.

Orbits: Negative

CTA HEAD

Anterior circulation: Atherosclerotic calcification in the cavernous
carotid bilaterally with mild to moderate cavernous carotid stenosis
bilaterally. Anterior and middle cerebral arteries patent
bilaterally without stenosis.

Posterior circulation: Both vertebral arteries patent to the
basilar. There is mild to moderate stenosis of the distal vertebral
artery bilaterally due to calcified plaque. PICA patent bilaterally.
Basilar widely patent. Superior cerebellar artery patent
bilaterally. Moderately severe focal stenosis of the proximal left
posterior cerebral artery. Severe disease in the right posterior
cerebral artery which is occluded.

Venous sinuses: Patent

Anatomic variants: Negative for aneurysm

Delayed phase:Normal enhancement on delayed imaging
IMPRESSION: Atrophy and mild chronic microvascular ischemia. No acute
intracranial abnormality

Mild to moderate stenosis of the cavernous carotid bilaterally due
to calcified stenosis

Mild to moderate stenosis distal vertebral artery bilaterally.

Moderate stenosis left posterior cerebral artery. Occluded right
posterior cerebral artery.
